# Patient Record
Sex: Female | Born: 1960 | Race: White | Hispanic: No | Smoking: Never smoker
Health system: Southern US, Community
[De-identification: ages and names within clinical notes are randomized; demographics above are authoritative.]

## PROBLEM LIST (undated history)

## (undated) HISTORY — PX: TONSILLECTOMY: SUR1361

## (undated) HISTORY — PX: NEPHRECTOMY: SHX65

---

## 1999-02-16 ENCOUNTER — Other Ambulatory Visit: Admission: RE | Admit: 1999-02-16 | Discharge: 1999-02-16 | Payer: Self-pay | Admitting: Gynecology

## 2000-03-01 ENCOUNTER — Other Ambulatory Visit: Admission: RE | Admit: 2000-03-01 | Discharge: 2000-03-01 | Payer: Self-pay | Admitting: Gynecology

## 2008-10-01 ENCOUNTER — Ambulatory Visit (HOSPITAL_COMMUNITY): Admission: RE | Admit: 2008-10-01 | Discharge: 2008-10-01 | Payer: Self-pay | Admitting: Internal Medicine

## 2009-10-07 ENCOUNTER — Ambulatory Visit (HOSPITAL_COMMUNITY): Admission: RE | Admit: 2009-10-07 | Discharge: 2009-10-07 | Payer: Self-pay | Admitting: Internal Medicine

## 2010-10-10 ENCOUNTER — Other Ambulatory Visit (HOSPITAL_COMMUNITY): Payer: Self-pay | Admitting: Internal Medicine

## 2010-10-11 ENCOUNTER — Other Ambulatory Visit (HOSPITAL_COMMUNITY): Payer: Self-pay | Admitting: Internal Medicine

## 2010-10-11 DIAGNOSIS — Z1231 Encounter for screening mammogram for malignant neoplasm of breast: Secondary | ICD-10-CM

## 2010-10-13 ENCOUNTER — Ambulatory Visit (HOSPITAL_COMMUNITY)
Admission: RE | Admit: 2010-10-13 | Discharge: 2010-10-13 | Disposition: A | Discharge status: Home / Self Care | Payer: Self-pay | Source: Ambulatory Visit | Attending: Internal Medicine | Admitting: Internal Medicine

## 2010-10-13 DIAGNOSIS — Z1231 Encounter for screening mammogram for malignant neoplasm of breast: Secondary | ICD-10-CM | POA: Insufficient documentation

## 2011-07-04 ENCOUNTER — Ambulatory Visit (INDEPENDENT_AMBULATORY_CARE_PROVIDER_SITE_OTHER): Payer: Self-pay | Admitting: *Deleted

## 2011-07-04 VITALS — BP 135/85 | HR 78 | Temp 98.1°F | Resp 16 | Ht 61.0 in | Wt 163.7 lb

## 2011-07-04 DIAGNOSIS — Z01419 Encounter for gynecological examination (general) (routine) without abnormal findings: Secondary | ICD-10-CM

## 2011-07-04 NOTE — Progress Notes (Signed)
No complaints today.  Pap Smear:    Completed Pap smear today. Last Pap smear was 09/07/08 and was normal. Per patient has no history of abnormal Pap smears.  Physical exam: Breasts Breasts symmetrical. No skin abnormalities bilateral breasts. No nipple retraction bilateral breasts. No nipple discharge bilateral breasts. No lymphadenopathy. No lumps palpated bilateral breasts.          Pelvic/Bimanual   Ext Genitalia No lesions, no swelling and no discharge observed on external genitalia.         Vagina Vagina pink and normal texture. No lesions observed in vagina. Blood observed in vagina. Patient is currently menstruating.          Cervix Cervix is present. Cervix pink and of normal texture. Blood observed at cervical os.          Uterus Uterus is present and palpable. Uterus in normal position and normal size.       Adnexae Bilateral ovaries present and palpable. No tenderness on palpation.        Rectovaginal No rectal exam completed today since patient had no rectal complaints.

## 2011-07-04 NOTE — Patient Instructions (Signed)
Taught patient how to perform BSE and gave educational materials to take home. Let her know [facility] will cover Pap smears every 3 years unless has a history of abnormal Pap smears. Told patient about free cervical cancer screenings to receive a Pap smear if would like one at 1-2 years. Patient's last mammogram was 10/13/10. Told patient she does not need another mammogram until after 10/13/11. Gave patient number to call and schedule mammogram. Patient has an IUD that needs to be taken out. Scheduled patient appointment at [facility] to have IUD removed on 08/10/11 at 1345. Gave patient appointment. Let patient know will follow up with her within the next couple weeks by letter or phone with results. Patient verbalized understanding.

## 2011-07-17 ENCOUNTER — Encounter: Payer: Self-pay | Admitting: Obstetrics and Gynecology

## 2011-08-10 ENCOUNTER — Ambulatory Visit: Payer: Self-pay | Admitting: Obstetrics and Gynecology

## 2011-08-28 ENCOUNTER — Ambulatory Visit: Payer: Self-pay | Admitting: Advanced Practice Midwife

## 2011-09-29 ENCOUNTER — Other Ambulatory Visit (HOSPITAL_COMMUNITY): Payer: Self-pay | Admitting: Internal Medicine

## 2011-09-29 DIAGNOSIS — Z1231 Encounter for screening mammogram for malignant neoplasm of breast: Secondary | ICD-10-CM

## 2011-10-25 ENCOUNTER — Ambulatory Visit (HOSPITAL_COMMUNITY): Payer: Self-pay

## 2011-12-28 ENCOUNTER — Ambulatory Visit (HOSPITAL_COMMUNITY)
Admission: RE | Admit: 2011-12-28 | Discharge: 2011-12-28 | Disposition: A | Discharge status: Home / Self Care | Payer: Self-pay | Source: Ambulatory Visit | Attending: Internal Medicine | Admitting: Internal Medicine

## 2011-12-28 DIAGNOSIS — Z1231 Encounter for screening mammogram for malignant neoplasm of breast: Secondary | ICD-10-CM

## 2012-01-03 ENCOUNTER — Encounter: Payer: Self-pay | Admitting: *Deleted

## 2015-05-31 ENCOUNTER — Other Ambulatory Visit: Payer: Self-pay | Admitting: Emergency Medicine

## 2015-05-31 DIAGNOSIS — Z1231 Encounter for screening mammogram for malignant neoplasm of breast: Secondary | ICD-10-CM

## 2015-06-02 ENCOUNTER — Ambulatory Visit
Admission: RE | Admit: 2015-06-02 | Discharge: 2015-06-02 | Disposition: A | Discharge status: Home / Self Care | Payer: No Typology Code available for payment source | Source: Ambulatory Visit | Attending: Emergency Medicine | Admitting: Emergency Medicine

## 2015-06-02 DIAGNOSIS — Z1231 Encounter for screening mammogram for malignant neoplasm of breast: Secondary | ICD-10-CM

## 2015-06-04 ENCOUNTER — Ambulatory Visit: Payer: Self-pay

## 2015-06-04 DIAGNOSIS — R928 Other abnormal and inconclusive findings on diagnostic imaging of breast: Secondary | ICD-10-CM

## 2015-06-04 NOTE — Progress Notes (Unsigned)
Discussed results of mammogram. [facility] to schedule furture studies

## 2015-07-14 ENCOUNTER — Other Ambulatory Visit: Payer: Self-pay | Admitting: Emergency Medicine

## 2015-07-14 DIAGNOSIS — R928 Other abnormal and inconclusive findings on diagnostic imaging of breast: Secondary | ICD-10-CM

## 2015-07-19 ENCOUNTER — Ambulatory Visit
Admission: RE | Admit: 2015-07-19 | Discharge: 2015-07-19 | Disposition: A | Discharge status: Home / Self Care | Payer: No Typology Code available for payment source | Source: Ambulatory Visit | Attending: Emergency Medicine | Admitting: Emergency Medicine

## 2015-07-19 DIAGNOSIS — R928 Other abnormal and inconclusive findings on diagnostic imaging of breast: Secondary | ICD-10-CM

## 2015-07-19 DIAGNOSIS — N63 Unspecified lump in breast: Secondary | ICD-10-CM | POA: Diagnosis not present

## 2016-01-26 ENCOUNTER — Other Ambulatory Visit: Payer: Self-pay | Admitting: Obstetrics and Gynecology

## 2016-01-28 LAB — CYTOLOGY - PAP

## 2016-02-04 ENCOUNTER — Other Ambulatory Visit: Payer: Self-pay | Admitting: Family Medicine

## 2016-02-05 LAB — CMP12+LP+TP+TSH+6AC+CBC/D/PLT
ALT: 42 IU/L — ABNORMAL HIGH (ref 0–32)
AST: 35 IU/L (ref 0–40)
Albumin/Globulin Ratio: 1.4 (ref 1.2–2.2)
Albumin: 4.2 g/dL (ref 3.5–5.5)
Alkaline Phosphatase: 141 IU/L — ABNORMAL HIGH (ref 39–117)
BUN/Creatinine Ratio: 26 — ABNORMAL HIGH (ref 9–23)
BUN: 11 mg/dL (ref 6–24)
Basophils Absolute: 0 x10E3/uL (ref 0.0–0.2)
Basos: 1 %
Bilirubin Total: 0.4 mg/dL (ref 0.0–1.2)
Calcium: 10.7 mg/dL — ABNORMAL HIGH (ref 8.7–10.2)
Chloride: 101 mmol/L (ref 96–106)
Chol/HDL Ratio: 2.1 ratio (ref 0.0–4.4)
Cholesterol, Total: 106 mg/dL (ref 100–199)
Creatinine, Ser: 0.43 mg/dL — ABNORMAL LOW (ref 0.57–1.00)
EOS (ABSOLUTE): 0.2 x10E3/uL (ref 0.0–0.4)
Eos: 3 %
Free Thyroxine Index: 6.9 — ABNORMAL HIGH (ref 1.2–4.9)
GFR calc Af Amer: 132 mL/min/1.73
GFR calc non Af Amer: 115 mL/min/1.73
GGT: 23 IU/L (ref 0–60)
Globulin, Total: 2.9 g/dL (ref 1.5–4.5)
Glucose: 92 mg/dL (ref 65–99)
HDL: 50 mg/dL
Hematocrit: 39 % (ref 34.0–46.6)
Hemoglobin: 12.7 g/dL (ref 11.1–15.9)
Immature Grans (Abs): 0 x10E3/uL (ref 0.0–0.1)
Immature Granulocytes: 0 %
Iron: 110 ug/dL (ref 27–159)
LDH: 240 IU/L — ABNORMAL HIGH (ref 119–226)
LDL Calculated: 39 mg/dL (ref 0–99)
Lymphocytes Absolute: 2.9 x10E3/uL (ref 0.7–3.1)
Lymphs: 41 %
MCH: 28 pg (ref 26.6–33.0)
MCHC: 32.6 g/dL (ref 31.5–35.7)
MCV: 86 fL (ref 79–97)
Monocytes Absolute: 0.8 x10E3/uL (ref 0.1–0.9)
Monocytes: 11 %
Neutrophils Absolute: 3.1 x10E3/uL (ref 1.4–7.0)
Neutrophils: 44 %
Phosphorus: 4.3 mg/dL (ref 2.5–4.5)
Platelets: 296 x10E3/uL (ref 150–379)
Potassium: 4.7 mmol/L (ref 3.5–5.2)
RBC: 4.54 x10E6/uL (ref 3.77–5.28)
RDW: 14.8 % (ref 12.3–15.4)
Sodium: 141 mmol/L (ref 134–144)
T3 Uptake Ratio: 44 % — ABNORMAL HIGH (ref 24–39)
T4, Total: 15.6 ug/dL — ABNORMAL HIGH (ref 4.5–12.0)
TSH: <0.006 u[IU]/mL — ABNORMAL LOW (ref 0.450–4.500)
Total Protein: 7.1 g/dL (ref 6.0–8.5)
Triglycerides: 87 mg/dL (ref 0–149)
Uric Acid: 6.3 mg/dL (ref 2.5–7.1)
VLDL Cholesterol Cal: 17 mg/dL (ref 5–40)
WBC: 7 x10E3/uL (ref 3.4–10.8)

## 2016-02-05 LAB — HGB A1C W/O EAG: Hgb A1c MFr Bld: 5.9 % — ABNORMAL HIGH (ref 4.8–5.6)

## 2016-02-05 LAB — VITAMIN D 25 HYDROXY (VIT D DEFICIENCY, FRACTURES): VIT D 25 HYDROXY: 45.6 ng/mL (ref 30.0–100.0)

## 2016-05-31 ENCOUNTER — Other Ambulatory Visit: Payer: Self-pay | Admitting: Internal Medicine

## 2016-05-31 DIAGNOSIS — Z1231 Encounter for screening mammogram for malignant neoplasm of breast: Secondary | ICD-10-CM

## 2016-06-05 ENCOUNTER — Ambulatory Visit: Admission: RE | Admit: 2016-06-05 | Payer: No Typology Code available for payment source | Source: Ambulatory Visit

## 2016-10-16 IMAGING — MG MM DIAG BREAST TOMO UNI RIGHT
6 series · 6 of 14 positions shown · non-contrast
Comparison: Previous exam(s).

CLINICAL DATA: Patient presents for additional views of the right
breast as followup to a recent screening exam suggesting a possible
mass over the upper breast on the MLO view only.

EXAM:
DIGITAL DIAGNOSTIC right MAMMOGRAM WITH 3D TOMOSYNTHESIS
ULTRASOUND right BREAST

[R CC]
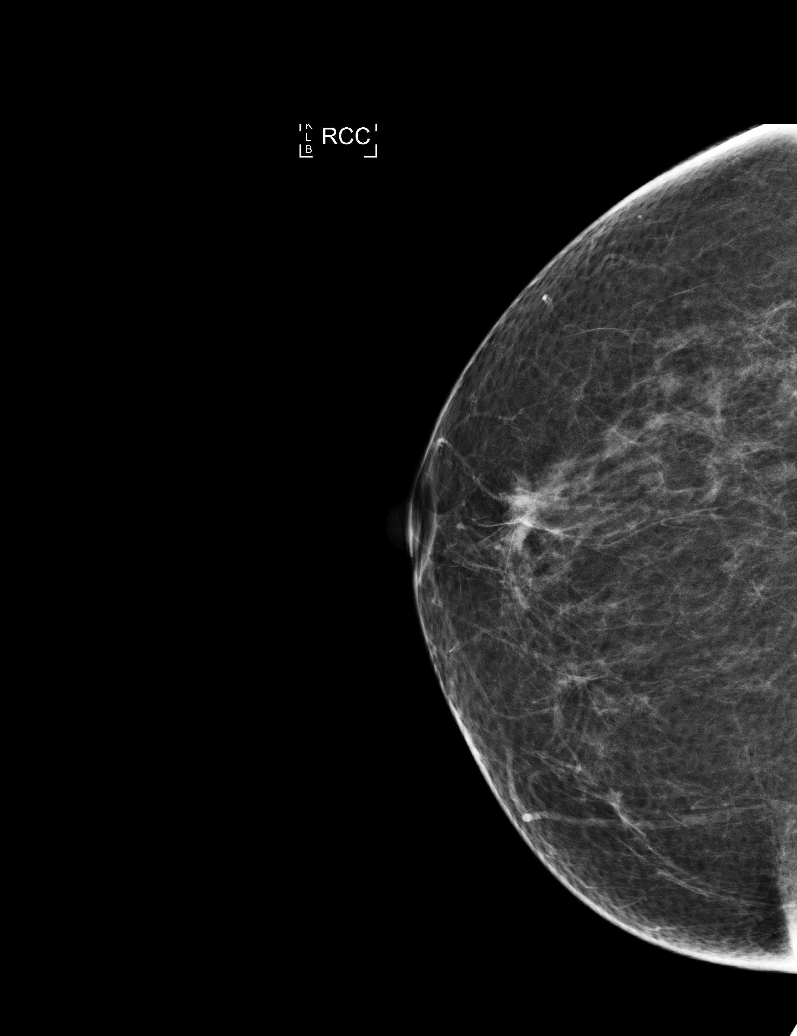

[R CC synth-2D]
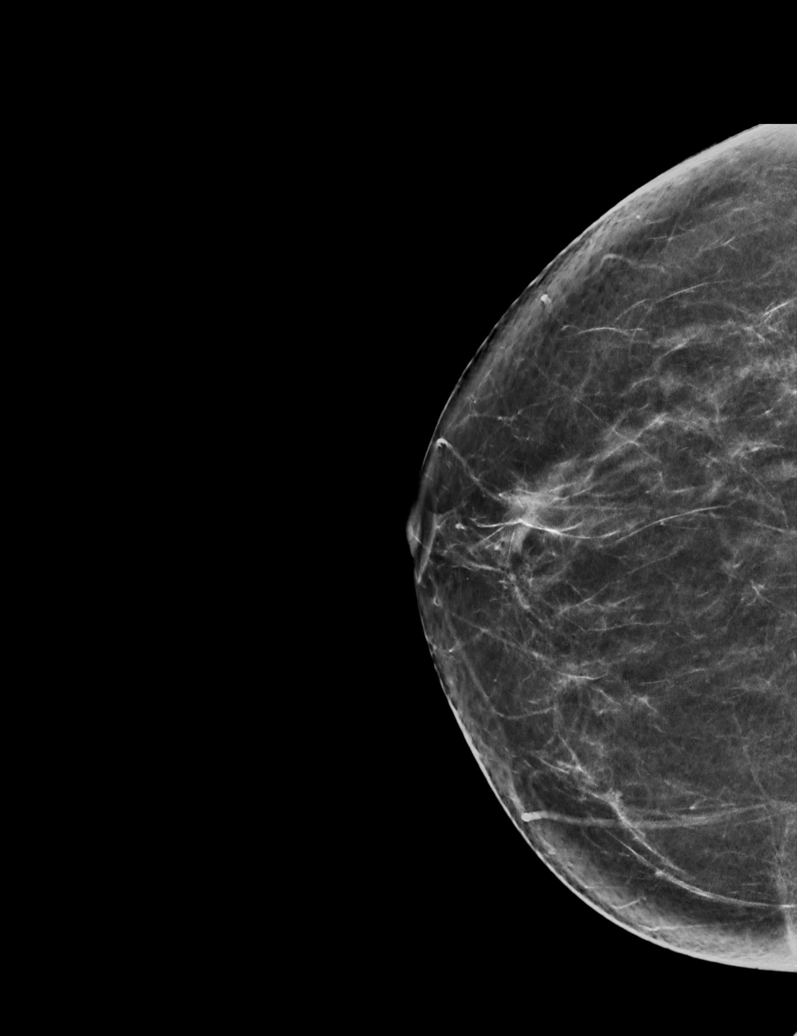

[R MLO synth-2D]
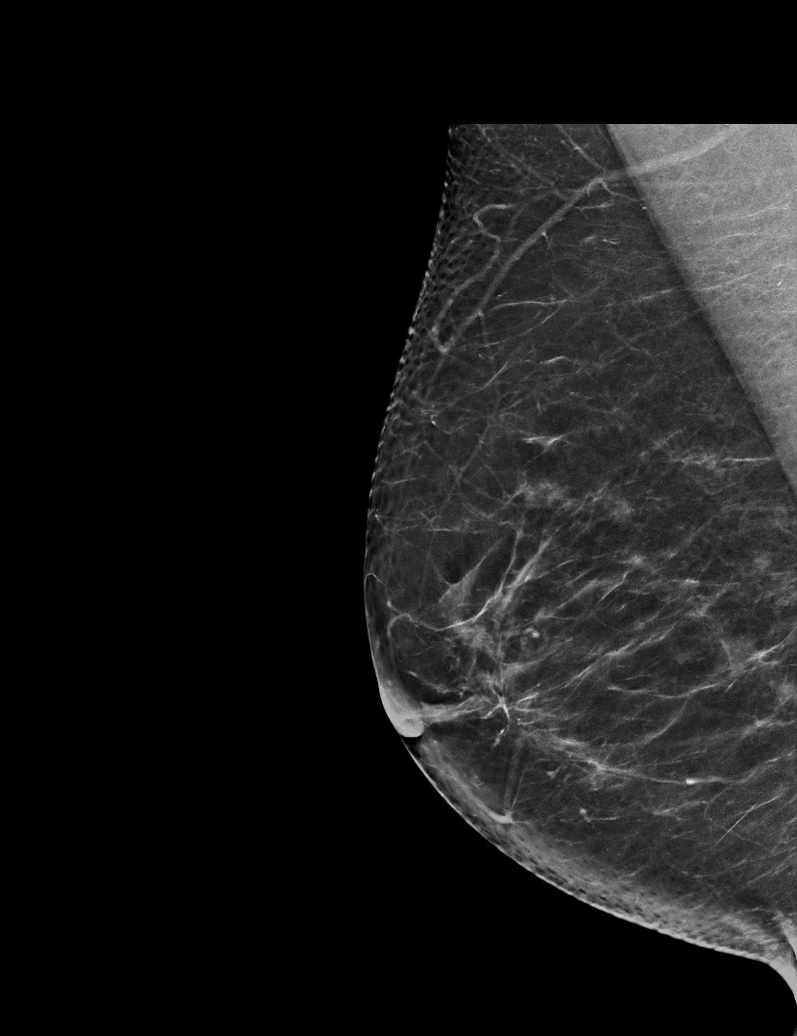

[R MLO]
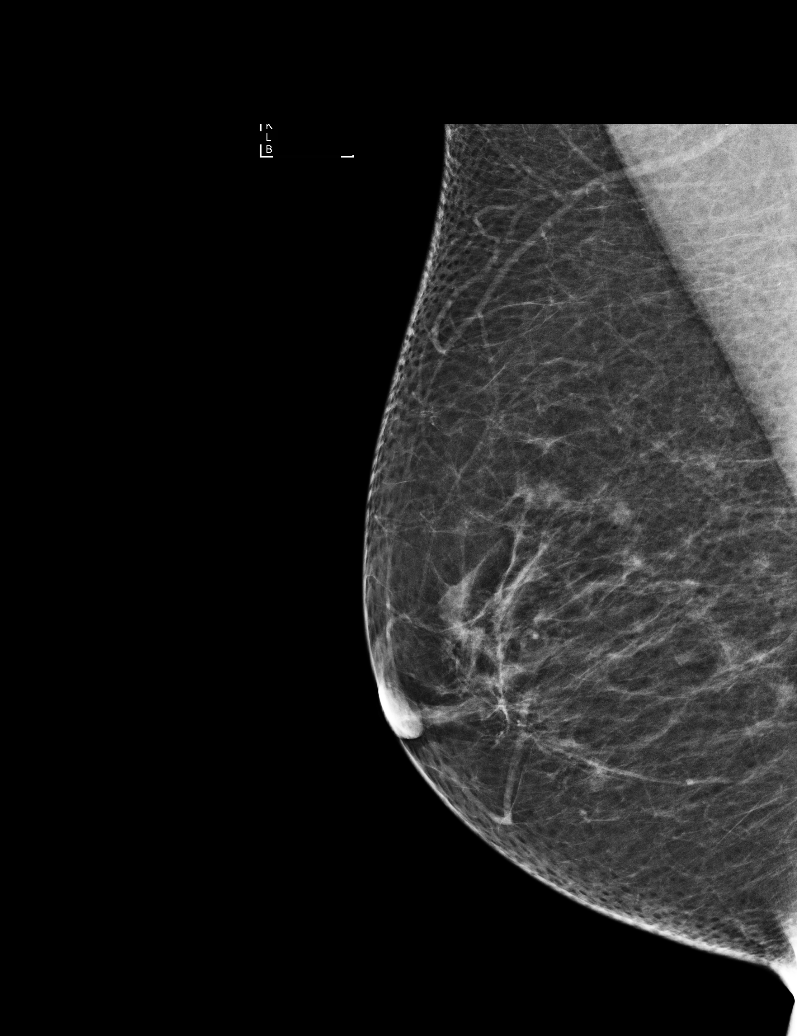

[R MLO tomo · tomo slice 37/74.0]
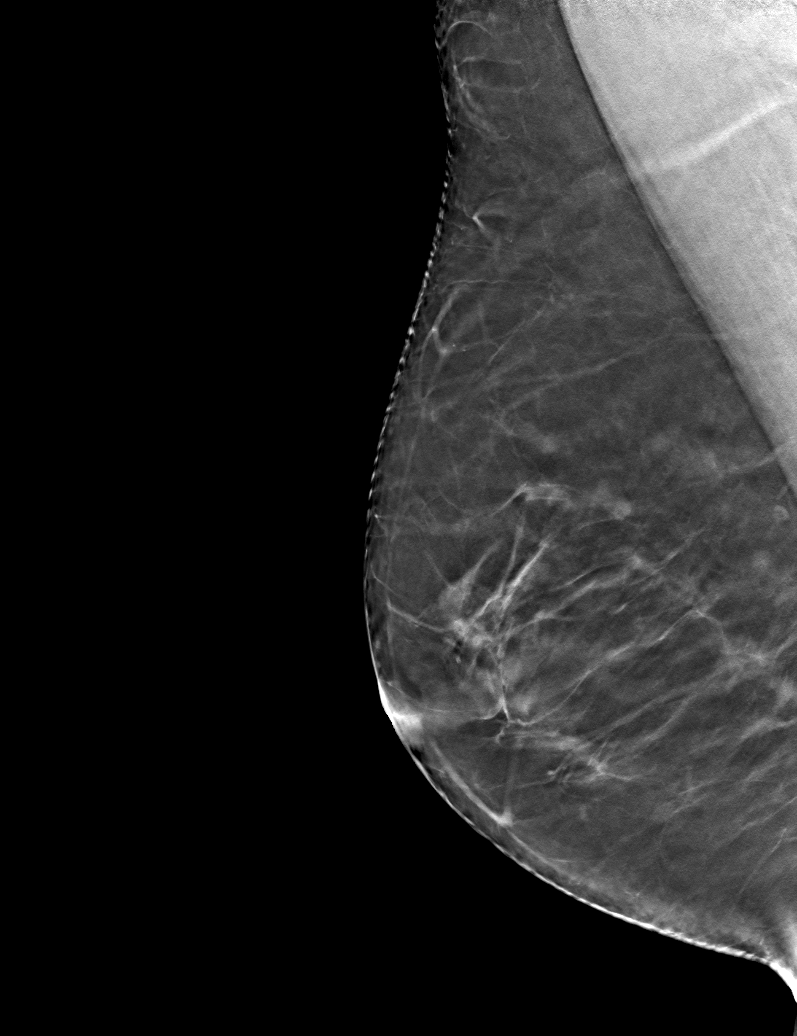

[R CC tomo · tomo slice 37/73.0]
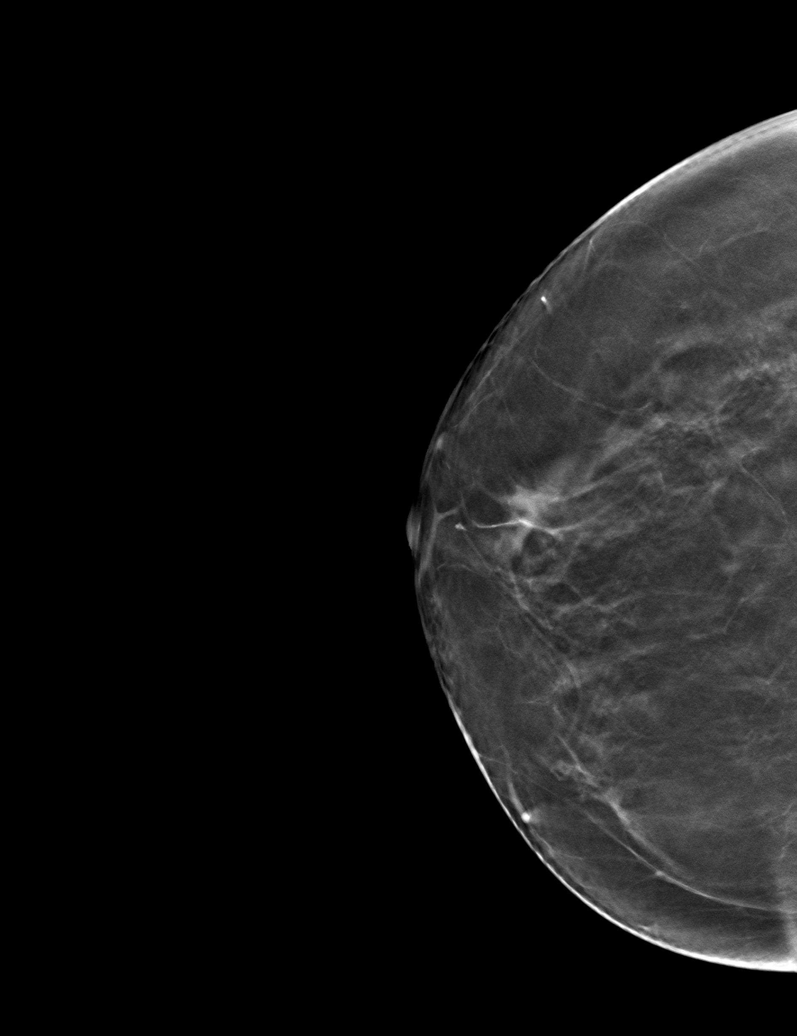

[6 of 14 positions shown; findings below may reference images not displayed]

ACR Breast Density Category b: There are scattered areas of
fibroglandular density.
FINDINGS: Cc and MLO tomographic images demonstrate findings suggesting
persistence of an oval circumscribed 5 mm mass over the posterior
third of the slightly outer upper right breast. Remainder of the
exam is unchanged.

Targeted ultrasound is performed, showing no definite focal
abnormality over the outer right breast from the [DATE] to the 11
o'clock position.
IMPRESSION: Probable benign mass over the slightly outer upper right breast not
seen sonographically.

RECOMMENDATION:
Recommend a 6 month followup diagnostic right breast mammogram to
document stability of this probable benign finding.

I have discussed the findings and recommendations with the patient.
Results were also provided in writing at the conclusion of the
visit. If applicable, a reminder letter will be sent to the patient
regarding the next appointment.

BI-RADS CATEGORY  3: Probably benign.

## 2017-01-26 ENCOUNTER — Ambulatory Visit: Payer: Self-pay | Admitting: *Deleted

## 2017-01-26 VITALS — BP 162/84 | HR 79 | Ht 61.5 in | Wt 162.0 lb

## 2017-01-26 DIAGNOSIS — Z0000 Encounter for general adult medical examination without abnormal findings: Secondary | ICD-10-CM

## 2017-01-26 NOTE — Progress Notes (Signed)
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 Denies HTN Hx.  Labs Drawn. Replacements ROI form signed. Tobacco Free Attestation form signed.  Forms placed in paper chart.

## 2017-01-27 LAB — CMP12+LP+TP+TSH+6AC+CBC/D/PLT
A/G RATIO: 1.3 (ref 1.2–2.2)
ALT: 27 IU/L (ref 0–32)
AST: 24 IU/L (ref 0–40)
Albumin: 4.3 g/dL (ref 3.5–5.5)
Alkaline Phosphatase: 102 IU/L (ref 39–117)
BUN / CREAT RATIO: 18 (ref 9–23)
BUN: 11 mg/dL (ref 6–24)
Basophils Absolute: 0 10*3/uL (ref 0.0–0.2)
Basos: 1 %
Bilirubin Total: 0.3 mg/dL (ref 0.0–1.2)
CALCIUM: 9.8 mg/dL (ref 8.7–10.2)
CHLORIDE: 101 mmol/L (ref 96–106)
CHOL/HDL RATIO: 2.4 ratio (ref 0.0–4.4)
CREATININE: 0.6 mg/dL (ref 0.57–1.00)
Cholesterol, Total: 137 mg/dL (ref 100–199)
EOS (ABSOLUTE): 0.2 10*3/uL (ref 0.0–0.4)
EOS: 3 %
Free Thyroxine Index: 3.2 (ref 1.2–4.9)
GFR, EST AFRICAN AMERICAN: 118 mL/min/{1.73_m2} (ref >59)
GFR, EST NON AFRICAN AMERICAN: 102 mL/min/{1.73_m2} (ref >59)
GGT: 18 IU/L (ref 0–60)
GLUCOSE: 91 mg/dL (ref 65–99)
Globulin, Total: 3.2 g/dL (ref 1.5–4.5)
HDL: 56 mg/dL (ref >39)
Hematocrit: 41.4 % (ref 34.0–46.6)
Hemoglobin: 13.8 g/dL (ref 11.1–15.9)
IRON: 81 ug/dL (ref 27–159)
Immature Grans (Abs): 0 10*3/uL (ref 0.0–0.1)
Immature Granulocytes: 0 %
LDH: 270 IU/L — AB (ref 119–226)
LDL Calculated: 59 mg/dL (ref 0–99)
LYMPHS ABS: 2.7 10*3/uL (ref 0.7–3.1)
Lymphs: 36 %
MCH: 30.5 pg (ref 26.6–33.0)
MCHC: 33.3 g/dL (ref 31.5–35.7)
MCV: 91 fL (ref 79–97)
MONOCYTES: 7 %
Monocytes Absolute: 0.5 10*3/uL (ref 0.1–0.9)
NEUTROS ABS: 3.9 10*3/uL (ref 1.4–7.0)
Neutrophils: 53 %
PHOSPHORUS: 3.8 mg/dL (ref 2.5–4.5)
POTASSIUM: 4.5 mmol/L (ref 3.5–5.2)
Platelets: 347 10*3/uL (ref 150–379)
RBC: 4.53 x10E6/uL (ref 3.77–5.28)
RDW: 14 % (ref 12.3–15.4)
Sodium: 140 mmol/L (ref 134–144)
T3 Uptake Ratio: 31 % (ref 24–39)
T4 TOTAL: 10.2 ug/dL (ref 4.5–12.0)
TOTAL PROTEIN: 7.5 g/dL (ref 6.0–8.5)
TSH: 0.016 u[IU]/mL — ABNORMAL LOW (ref 0.450–4.500)
Triglycerides: 109 mg/dL (ref 0–149)
URIC ACID: 5.5 mg/dL (ref 2.5–7.1)
VLDL Cholesterol Cal: 22 mg/dL (ref 5–40)
WBC: 7.4 10*3/uL (ref 3.4–10.8)

## 2017-01-27 LAB — HGB A1C W/O EAG: HEMOGLOBIN A1C: 5.6 % (ref 4.8–5.6)

## 2017-02-01 DIAGNOSIS — A630 Anogenital (venereal) warts: Secondary | ICD-10-CM | POA: Insufficient documentation

## 2017-02-01 DIAGNOSIS — E0590 Thyrotoxicosis, unspecified without thyrotoxic crisis or storm: Secondary | ICD-10-CM | POA: Insufficient documentation

## 2017-02-23 NOTE — Progress Notes (Signed)
Results reviewed with pt. Pt reports she was told "they thought it may be Grave's, but they weren't sure." Instructed pt to f/u with PCP and endocrinology. Reviewed diet and exercise recommendations. Pt verbalizes understanding of all instructions and agrees to schedule f/u soon. No further questions/concerns. Copy provided for pt, as well as [facility] handout on Graves disease.

## 2017-05-22 ENCOUNTER — Other Ambulatory Visit: Payer: Self-pay | Admitting: Obstetrics and Gynecology

## 2017-05-22 DIAGNOSIS — R928 Other abnormal and inconclusive findings on diagnostic imaging of breast: Secondary | ICD-10-CM

## 2017-05-22 DIAGNOSIS — N63 Unspecified lump in unspecified breast: Secondary | ICD-10-CM

## 2017-05-30 ENCOUNTER — Other Ambulatory Visit: Payer: Self-pay

## 2017-06-15 ENCOUNTER — Ambulatory Visit
Admission: RE | Admit: 2017-06-15 | Discharge: 2017-06-15 | Disposition: A | Discharge status: Home / Self Care | Payer: No Typology Code available for payment source | Source: Ambulatory Visit | Attending: Obstetrics and Gynecology | Admitting: Obstetrics and Gynecology

## 2017-06-15 ENCOUNTER — Ambulatory Visit: Payer: Self-pay

## 2017-06-15 DIAGNOSIS — N63 Unspecified lump in unspecified breast: Secondary | ICD-10-CM

## 2017-06-15 DIAGNOSIS — R928 Other abnormal and inconclusive findings on diagnostic imaging of breast: Secondary | ICD-10-CM

## 2017-06-18 ENCOUNTER — Encounter (HOSPITAL_COMMUNITY): Payer: Self-pay

## 2017-07-14 DEATH — deceased

## 2017-10-02 ENCOUNTER — Ambulatory Visit: Payer: Self-pay | Admitting: Registered Nurse

## 2017-10-02 VITALS — BP 154/88 | HR 68 | Temp 97.8°F

## 2017-10-02 DIAGNOSIS — H6593 Unspecified nonsuppurative otitis media, bilateral: Secondary | ICD-10-CM

## 2017-10-02 DIAGNOSIS — J0100 Acute maxillary sinusitis, unspecified: Secondary | ICD-10-CM

## 2017-10-02 MED ORDER — AMOXICILLIN 875 MG PO TABS: 875.0000 mg | ORAL_TABLET | Freq: Two times a day (BID) | ORAL | 0 refills | Status: AC

## 2017-10-02 MED ORDER — ZINC 13.3 MG MT LOZG: 1.0000 | LOZENGE | Freq: Three times a day (TID) | OROMUCOSAL | Status: AC

## 2017-10-02 MED ORDER — SALINE SPRAY 0.65 % NA SOLN: 2.0000 | NASAL | 0 refills | Status: DC

## 2017-10-02 MED ORDER — FLUTICASONE PROPIONATE 50 MCG/ACT NA SUSP: 1.0000 | Freq: Every day | NASAL | 6 refills | Status: DC

## 2017-10-02 NOTE — Patient Instructions (Signed)
Sinus Rinse What is a sinus rinse? A sinus rinse is a simple home treatment that is used to rinse your sinuses with a sterile mixture of salt and water (saline solution). Sinuses are air-filled spaces in your skull behind the bones of your face and forehead that open into your nasal cavity. You will use the following:  Saline solution.  Neti pot or spray bottle. This releases the saline solution into your nose and through your sinuses. Neti pots and spray bottles can be purchased at [employer], a health food store, or online.  When would I do a sinus rinse? A sinus rinse can help to clear mucus, dirt, dust, or pollen from the nasal cavity. You may do a sinus rinse when you have a cold, a virus, nasal allergy symptoms, a sinus infection, or stuffiness in the nose or sinuses. If you are considering a sinus rinse:  Ask your child's health care provider before performing a sinus rinse on your child.  Do not do a sinus rinse if you have had ear or nasal surgery, ear infection, or blocked ears.  How do I do a sinus rinse?  Wash your hands.  Disinfect your device according to the directions provided and then dry it.  Use the solution that comes with your device or one that is sold separately in stores. Follow the mixing directions on the package.  Fill your device with the amount of saline solution as directed by the device instructions.  Stand over a sink and tilt your head sideways over the sink.  Place the spout of the device in your upper nostril (the one closer to the ceiling).  Gently pour or squeeze the saline solution into the nasal cavity. The liquid should drain to the lower nostril if you are not overly congested.  Gently blow your nose. Blowing too hard may cause ear pain.  Repeat in the other nostril.  Clean and rinse your device with clean water and then air-dry it. Are there risks of a sinus rinse? Sinus rinse is generally very safe and effective. However,  there are a few risks, which include:  A burning sensation in the sinuses. This may happen if you do not make the saline solution as directed. Make sure to follow all directions when making the saline solution.  Infection from contaminated water. This is rare, but possible.  Nasal irritation.  This information is not intended to replace advice given to you by your health care provider. Make sure you discuss any questions you have with your health care provider. Document Released: 02/25/2014 Document Revised: 06/27/2016 Document Reviewed: 12/16/2013 Elsevier Interactive Patient Education  2017 [employer] Sinusitis, Adult Sinusitis is soreness and inflammation of your sinuses. Sinuses are hollow spaces in the bones around your face. Your sinuses are located:  Around your eyes.  In the middle of your forehead.  Behind your nose.  In your cheekbones.  Your sinuses and nasal passages are lined with a stringy fluid (mucus). Mucus normally drains out of your sinuses. When your nasal tissues become inflamed or swollen, the mucus can become trapped or blocked so air cannot flow through your sinuses. This allows bacteria, viruses, and funguses to grow, which leads to infection. Sinusitis can develop quickly and last for 7?10 days (acute) or for more than 12 weeks (chronic). Sinusitis often develops after a cold. What are the causes? This condition is caused by anything that creates swelling in the sinuses or stops mucus from draining, including:  Allergies.  Asthma.  Bacterial or viral infection.  Abnormally shaped bones between the nasal passages.  Nasal growths that contain mucus (nasal polyps).  Narrow sinus openings.  Pollutants, such as chemicals or irritants in the air.  A foreign object stuck in the nose.  A fungal infection. This is rare.  What increases the risk? The following factors may make you more likely to develop this condition:  Having allergies or  asthma.  Having had a recent cold or respiratory tract infection.  Having structural deformities or blockages in your nose or sinuses.  Having a weak immune system.  Doing a lot of swimming or diving.  Overusing nasal sprays.  Smoking.  What are the signs or symptoms? The main symptoms of this condition are pain and a feeling of pressure around the affected sinuses. Other symptoms include:  Upper toothache.  Earache.  Headache.  Bad breath.  Decreased sense of smell and taste.  A cough that may get worse at night.  Fatigue.  Fever.  Thick drainage from your nose. The drainage is often green and it may contain pus (purulent).  Stuffy nose or congestion.  Postnasal drip. This is when extra mucus collects in the throat or back of the nose.  Swelling and warmth over the affected sinuses.  Sore throat.  Sensitivity to light.  How is this diagnosed? This condition is diagnosed based on symptoms, a medical history, and a physical exam. To find out if your condition is acute or chronic, your health care provider may:  Look in your nose for signs of nasal polyps.  Tap over the affected sinus to check for signs of infection.  View the inside of your sinuses using an imaging device that has a light attached (endoscope).  If your health care provider suspects that you have chronic sinusitis, you may also:  Be tested for allergies.  Have a sample of mucus taken from your nose (nasal culture) and checked for bacteria.  Have a mucus sample examined to see if your sinusitis is related to an allergy.  If your sinusitis does not respond to treatment and it lasts longer than 8 weeks, you may have an MRI or CT scan to check your sinuses. These scans also help to determine how severe your infection is. In rare cases, a bone biopsy may be done to rule out more serious types of fungal sinus disease. How is this treated? Treatment for sinusitis depends on the cause and  whether your condition is chronic or acute. If a virus is causing your sinusitis, your symptoms will go away on their own within 10 days. You may be given medicines to relieve your symptoms, including:  Topical nasal decongestants. They shrink swollen nasal passages and let mucus drain from your sinuses.  Antihistamines. These drugs block inflammation that is triggered by allergies. This can help to ease swelling in your nose and sinuses.  Topical nasal corticosteroids. These are nasal sprays that ease inflammation and swelling in your nose and sinuses.  Nasal saline washes. These rinses can help to get rid of thick mucus in your nose.  If your condition is caused by bacteria, you will be given an antibiotic medicine. If your condition is caused by a fungus, you will be given an antifungal medicine. Surgery may be needed to correct underlying conditions, such as narrow nasal passages. Surgery may also be needed to remove polyps. Follow these instructions at home: Medicines  Take, use, or apply over-the-counter and prescription medicines only as told by   your health care provider. These may include nasal sprays.  If you were prescribed an antibiotic medicine, take it as told by your health care provider. Do not stop taking the antibiotic even if you start to feel better. Hydrate and Humidify  Drink enough water to keep your urine clear or pale yellow. Staying hydrated will help to thin your mucus.  Use a cool mist humidifier to keep the humidity level in your home above 50%.  Inhale steam for 10-15 minutes, 3-4 times a day or as told by your health care provider. You can do this in the bathroom while a hot shower is running.  Limit your exposure to cool or dry air. Rest  Rest as much as possible.  Sleep with your head raised (elevated).  Make sure to get enough sleep each night. General instructions  Apply a warm, moist washcloth to your face 3-4 times a day or as told by your  health care provider. This will help with discomfort.  Wash your hands often with soap and water to reduce your exposure to viruses and other germs. If soap and water are not available, use hand sanitizer.  Do not smoke. Avoid being around people who are smoking (secondhand smoke).  Keep all follow-up visits as told by your health care provider. This is important. Contact a health care provider if:  You have a fever.  Your symptoms get worse.  Your symptoms do not improve within 10 days. Get help right away if:  You have a severe headache.  You have persistent vomiting.  You have pain or swelling around your face or eyes.  You have vision problems.  You develop confusion.  Your neck is stiff.  You have trouble breathing. This information is not intended to replace advice given to you by your health care provider. Make sure you discuss any questions you have with your health care provider. Document Released: 07/31/2005 Document Revised: 03/26/2016 Document Reviewed: 05/26/2015 Elsevier Interactive Patient Education  2018 [employer]. Otitis Media With Effusion, Pediatric Otitis media with effusion (OME) occurs when there is inflammation of the middle ear and fluid in the middle ear space. There are no signs and symptoms of infection. The middle ear space contains air and the bones for hearing. Air in the middle ear space helps to transmit sound to the brain. OME is a common condition in children, and it often occurs after an ear infection. This condition may be present for several weeks or longer after an ear infection. Most cases of this condition get better on their own. What are the causes? OME is caused by a blockage of the eustachian tube in one or both ears. These tubes drain fluid in the ears to the back of the nose (nasopharynx). If the tissue in the tube swells up (edema), the tube closes. This prevents fluid from draining. Blockage can be caused by:  Ear  infections.  Colds and other upper respiratory infections.  Allergies.  Irritants, such as tobacco smoke.  Enlarged adenoids. The adenoids are areas of soft tissue located high in the back of the throat, behind the nose and the roof of the mouth. They are part of the body's natural defense (immune) system.  A mass in the nasopharynx.  Damage to the ear caused by pressure changes (barotrauma).  What increases the risk? Your child is more likely to develop this condition if:  He or she has repeated ear and sinus infections.  He or she has allergies.  He  or she is exposed to tobacco smoke.  He or she attends daycare.  He or she is not breastfed.  What are the signs or symptoms? Symptoms of this condition may not be obvious. Sometimes this condition does not have any symptoms, or symptoms may overlap with those of a cold or upper respiratory tract illness. Symptoms of this condition include:  Temporary hearing loss.  A feeling of fullness in the ear without pain.  Irritability or agitation.  Balance (vestibular) problems.  As a result of hearing loss, your child may:  Listen to the TV at a loud volume.  Not respond to questions.  Ask "What?" often when spoken to.  Mistake or confuse one sound or word for another.  Perform poorly at school.  Have a poor attention span.  Become agitated or irritated easily.  How is this diagnosed? This condition is diagnosed with an ear exam. Your child's health care provider will look inside your child's ear with an instrument (otoscope) to check for redness, swelling, and fluid. Other tests may be done, including:  A test to check the movement of the eardrum (pneumatic otoscopy). This is done by squeezing a small amount of air into the ear.  A test that changes air pressure in the middle ear to check how well the eardrum moves and to see if the eustachian tube is working (tympanogram).  Hearing test (audiogram). This test  involves playing tones at different pitches to see if your child can hear each tone.  How is this treated? Treatment for this condition depends on the cause. In many cases, the fluid goes away on its own. In some cases, your child may need a procedure to create a hole in the eardrum to allow fluid to drain (myringotomy) and to insert small drainage tubes (tympanostomy tubes) into the eardrums. These tubes help to drain fluid and prevent infection. This procedure may be recommended if:  OME does not get better over several months.  Your child has many ear infections within several months.  Your child has noticeable hearing loss.  Your child has problems with speech and language development.  Surgery may also be done to remove the adenoids (adenoidectomy). Follow these instructions at home:  Give over-the-counter and prescription medicines only as told by your child's health care provider.  Keep children away from any tobacco smoke.  Keep all follow-up visits as told by your child's health care provider. This is important. How is this prevented?  Keep your child's vaccinations up to date. Make sure your child gets all recommended vaccinations, including a pneumonia and flu vaccine.  Encourage hand washing. Your child should wash his or her hands often with soap and water. If there is no soap and water, he or she should use hand sanitizer.  Avoid exposing your child to tobacco smoke.  Breastfeed your baby, if possible. Babies who are breastfed as long as possible are less likely to develop this condition. Contact a health care provider if:  Your child's hearing does not get better after 3 months.  Your child's hearing is worse.  Your child has ear pain.  Your child has a fever.  Your child has drainage from the ear.  Your child is dizzy.  Your child has a lump on his or her neck. Get help right away if:  Your child has bleeding from the nose.  Your child cannot move  part of her or his face.  Your child has trouble breathing.  Your child cannot  smell.  Your child develops severe congestion.  Your child develops weakness.  Your child who is younger than 3 months has a temperature of 100F (38C) or higher. Summary  Otitis media with effusion (OME) occurs when there is inflammation of the middle ear and fluid in the middle ear space.  This condition is caused by blockage of one or both eustachian tubes, which drain fluid in the ears to the back of the nose.  Symptoms of this condition can include temporary hearing loss, a feeling of fullness in the ear, irritability or agitation, and balance (vestibular) problems. Sometimes, there are no symptoms.  This condition is diagnosed with an ear exam and tests, such as pneumatic otoscopy, tympanogram, and audiogram.  Treatment for this condition depends on the cause. In many cases, the fluid goes away on its own. This information is not intended to replace advice given to you by your health care provider. Make sure you discuss any questions you have with your health care provider. Document Released: 10/21/2003 Document Revised: 06/22/2016 Document Reviewed: 06/22/2016 Elsevier Interactive Patient Education  2017 [employer]

## 2017-10-02 NOTE — Progress Notes (Signed)
Subjective:    Patient ID: Regina Hutchinson, female    DOB: March 27, 1961, 57 y.o.   MRN: [id number]  56y/o caucasian female new Pt c/o frontal and maxillary sinus pain/pressure, bil ear pressure, sore throat, chest congestion, prod cough. Sx x1 week. Reports body aches and subjective fever over the weekend but that these have resolved now. + sick contacts at work.  Has tried OTC medications.  Denied seasonal allergies.      Review of Systems  Constitutional: Positive for fever. Negative for activity change, appetite change, chills, diaphoresis, fatigue and unexpected weight change.  HENT: Positive for congestion, ear pain, postnasal drip, rhinorrhea, sinus pressure, sinus pain and sore throat. Negative for dental problem, drooling, ear discharge, facial swelling, hearing loss, mouth sores, nosebleeds, sneezing, tinnitus, trouble swallowing and voice change.   Eyes: Negative for photophobia, pain, discharge, redness, itching and visual disturbance.  Respiratory: Positive for cough. Negative for choking, chest tightness, shortness of breath, wheezing and stridor.   Cardiovascular: Negative for chest pain, palpitations and leg swelling.  Gastrointestinal: Negative for abdominal distention, abdominal pain, blood in stool, constipation, diarrhea, nausea and vomiting.  Endocrine: Negative for cold intolerance and heat intolerance.  Genitourinary: Negative for difficulty urinating, dysuria and hematuria.  Musculoskeletal: Positive for myalgias. Negative for arthralgias, back pain, gait problem, joint swelling, neck pain and neck stiffness.  Skin: Negative for color change, pallor, rash and wound.  Allergic/Immunologic: Negative for environmental allergies and food allergies.  Neurological: Positive for headaches. Negative for dizziness, tremors, seizures, syncope, facial asymmetry, speech difficulty, weakness, light-headedness and numbness.  Hematological: Negative for adenopathy. Does not bruise/bleed  easily.  Psychiatric/Behavioral: Negative for agitation, behavioral problems, confusion and sleep disturbance.       Objective:   Physical Exam  Constitutional: She is oriented to person, place, and time. She appears well-developed and well-nourished. She is active and cooperative.  Non-toxic appearance. She does not have a sickly appearance. She appears ill. No distress.  HENT:  Head: Normocephalic and atraumatic.  Right Ear: Hearing, external ear and ear canal normal. A middle ear effusion is present.  Left Ear: Hearing, external ear and ear canal normal. A middle ear effusion is present.  Nose: Mucosal edema and rhinorrhea present. No nose lacerations, sinus tenderness, nasal deformity, septal deviation or nasal septal hematoma. No epistaxis.  No foreign bodies. Right sinus exhibits maxillary sinus tenderness. Right sinus exhibits no frontal sinus tenderness. Left sinus exhibits maxillary sinus tenderness. Left sinus exhibits no frontal sinus tenderness.  Mouth/Throat: Uvula is midline and mucous membranes are normal. Mucous membranes are not pale, not dry and not cyanotic. She does not have dentures. No oral lesions. No trismus in the jaw. Normal dentition. No dental abscesses, uvula swelling, lacerations or dental caries. Posterior oropharyngeal edema and posterior oropharyngeal erythema present. No oropharyngeal exudate or tonsillar abscesses.  Cobblestoning posterior pharynx; bilateral allergic shiners; bilateral nasal turbinates with clear discharge erythema; 10%  Cerumen impacted 12-2 oclock right ear; bilateral TMs air fluid level clear  Eyes: Conjunctivae, EOM and lids are normal. Pupils are equal, round, and reactive to light. Right eye exhibits no chemosis, no discharge, no exudate and no hordeolum. No foreign body present in the right eye. Left eye exhibits no chemosis, no discharge, no exudate and no hordeolum. No foreign body present in the left eye. Right conjunctiva is not injected.  Right conjunctiva has no hemorrhage. Left conjunctiva is not injected. Left conjunctiva has no hemorrhage. No scleral icterus. Right eye exhibits normal extraocular motion  and no nystagmus. Left eye exhibits normal extraocular motion and no nystagmus. Right pupil is round and reactive. Left pupil is round and reactive. Pupils are equal.  Neck: Trachea normal, normal range of motion and phonation normal. Neck supple. No tracheal tenderness and no muscular tenderness present. No neck rigidity. No tracheal deviation, no edema, no erythema and normal range of motion present. No thyroid mass and no thyromegaly present.  Cardiovascular: Normal rate, regular rhythm, S1 normal, S2 normal, normal heart sounds and intact distal pulses. PMI is not displaced. Exam reveals no gallop and no friction rub.  No murmur heard. Pulmonary/Chest: Effort normal and breath sounds normal. No accessory muscle usage or stridor. No respiratory distress. She has no decreased breath sounds. She has no wheezes. She has no rhonchi. She has no rales. She exhibits no tenderness.  No cough observed in exam room; spoke full sentences without difficulty  Abdominal: Soft. Normal appearance. She exhibits no distension, no fluid wave and no ascites. There is no rigidity and no guarding.  Musculoskeletal: Normal range of motion. She exhibits no edema or tenderness.       Right shoulder: Normal.       Left shoulder: Normal.       Right elbow: Normal.      Left elbow: Normal.       Right hip: Normal.       Left hip: Normal.       Right knee: Normal.       Left knee: Normal.       Cervical back: Normal.       Thoracic back: Normal.       Lumbar back: Normal.       Right hand: Normal.       Left hand: Normal.  Lymphadenopathy:       Head (right side): No submental, no submandibular, no tonsillar, no preauricular, no posterior auricular and no occipital adenopathy present.       Head (left side): No submental, no submandibular, no  tonsillar, no preauricular, no posterior auricular and no occipital adenopathy present.    She has no cervical adenopathy.       Right cervical: No superficial cervical, no deep cervical and no posterior cervical adenopathy present.      Left cervical: No superficial cervical, no deep cervical and no posterior cervical adenopathy present.  Neurological: She is alert and oriented to person, place, and time. She has normal strength. She is not disoriented. She displays no atrophy and no tremor. No cranial nerve deficit or sensory deficit. She exhibits normal muscle tone. She displays no seizure activity. Coordination and gait normal. GCS eye subscore is 4. GCS verbal subscore is 5. GCS motor subscore is 6.  On/off exam table/in/out of chair without difficulty; gait sure and steady in hallway  Skin: Skin is warm, dry and intact. No abrasion, no bruising, no burn, no ecchymosis, no laceration, no lesion, no petechiae and no rash noted. She is not diaphoretic. No cyanosis or erythema. No pallor. Nails show no clubbing.  Psychiatric: She has a normal mood and affect. Her speech is normal and behavior is normal. Judgment and thought content normal. Cognition and memory are normal.  Nursing note and vitals reviewed.         Assessment & Plan:  A-acute Maxillary sinusitis nonrecurrent  P-Restart flonase 1 spray each nostril BID #1 RF6 electronic Rx, saline 2 sprays each nostril q2h wa prn congestion.  If no improvement with 48 hours of saline  and flonase use start amoxicillin 875mg  po BID x 10 days #20 RF0 dispensed from [facility].  Tylenol 1000mg  po qID prn pain 4 UD given to patient from clinic stock discussed tylenol doesn raise BP  Denied personal or family history of ENT cancer.  Shower BID especially prior to bed. No evidence of systemic bacterial infection, non toxic and well hydrated.  I do not see where any further testing or imaging is necessary at this time.   I will suggest supportive care, rest,  good hygiene and encourage the patient to take adequate fluids.  The patient is to return to clinic or EMERGENCY ROOM if symptoms worsen or change significantly.  Exitcare handout on sinusitis and sinus rinse given to patient.  Patient verbalized agreement and understanding of treatment plan and had no further questions at this time.   P2:  Hand washing and cover cough  Patient may use normal saline nasal spray 2 sprays each nostril q2h wa as needed. flonase 1 spray each nostril BID #1 RF6 electronic Rx.  Patient denied personal or family history of ENT cancer.  OTC antihistamine of choice claritin/zyrtec 10mg  po daily.  Avoid triggers if possible.  Shower prior to bedtime if exposed to triggers.  If allergic dust/dust mites recommend mattress/pillow covers/encasements; washing linens, vacuuming, sweeping, dusting weekly.  Call or return to clinic as needed if these symptoms worsen or fail to improve as anticipated.   Exitcare handout on allergic rhinitis and sinus rinse given to patient.  Patient verbalized understanding of instructions, agreed with plan of care and had no further questions at this time.  P2:  Avoidance and hand washing.  Hard cerumen upper right canal.  Stop using qtips in ears.  Start 5 gtts mineral oil right external ear canal daily.  Patient verbalized understanding, agreed with plan of care and had no further questions at this time.

## 2019-03-25 ENCOUNTER — Telehealth: Payer: Self-pay | Admitting: *Deleted

## 2019-03-25 NOTE — Telephone Encounter (Signed)
Noted reviewed work injury report from supervisor and patient to follow up for NP evaluation if not improving or worsening symptoms.

## 2019-03-25 NOTE — Telephone Encounter (Signed)
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 Pt reported some flat boxes fell behind her yesterday and she tripped on them and hit posterior R upper arm on metal table. She reports site is tender to palpation but not painful. Bruising in oblong shape approx 3"x1". She sts she applied CBD salve to site at home this morning and this helped with some of the pain. Pt has no concerns related to injury. Advised to f/u with RN if site or pain worsens or does not improve over next 3-4 days. She verbalizes understanding and agreement with plan of care. Incident report completed by supervisor and HR aware pt has been evaluated.

## 2019-04-29 ENCOUNTER — Encounter: Payer: Self-pay | Admitting: Registered Nurse

## 2019-04-29 ENCOUNTER — Telehealth: Payer: Self-pay | Admitting: Registered Nurse

## 2019-04-29 NOTE — Telephone Encounter (Signed)
Patient presented to clinic today for follow up.  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  Patient had initially seen RN Haley abrasion and ecchymosis noted but no functional defects.  Patient wanted to show me lump today but she could not find it when palpating herself or when I palpated upper arm where she last remembered feeling it.  Patient denied pain/bruising/swelling/weakness/numbness right upper arm.  Full AROM and strength 5/5 bilaterally equal upper and lower extremities.  Discussed with patient after a fall/contusion there can be localized muscular swelling and/or a hematoma that will resolve gradually over time and this probably was what she was feeling and it has since resolved. She is to follow up for re-evaluation if she notes a return of lump. Patient verbalized understanding information/instructions, agreed with plan of care and had no further questions at this time.

## 2019-06-20 DIAGNOSIS — R638 Other symptoms and signs concerning food and fluid intake: Secondary | ICD-10-CM | POA: Insufficient documentation

## 2020-05-03 ENCOUNTER — Ambulatory Visit: Payer: Self-pay | Admitting: *Deleted

## 2020-05-03 ENCOUNTER — Other Ambulatory Visit: Payer: Self-pay

## 2020-05-03 VITALS — Wt 179.0 lb

## 2020-05-03 DIAGNOSIS — Z0000 Encounter for general adult medical examination without abnormal findings: Secondary | ICD-10-CM

## 2020-05-03 NOTE — Progress Notes (Signed)
Labs per pt request. Reports she had labs drawn over one year ago at pcp but has upcoming CPE and would like them completed again prior to that. Last labs on file with clinic from June 2018.

## 2020-05-05 LAB — CMP12+LP+TP+TSH+6AC+CBC/D/PLT
ALT: 36 IU/L — ABNORMAL HIGH (ref 0–32)
AST: 36 IU/L (ref 0–40)
Albumin/Globulin Ratio: 1.7 (ref 1.2–2.2)
Albumin: 4.8 g/dL (ref 3.8–4.9)
Alkaline Phosphatase: 93 IU/L (ref 44–121)
BUN/Creatinine Ratio: 10 (ref 9–23)
BUN: 9 mg/dL (ref 6–24)
Basophils Absolute: 0.1 10*3/uL (ref 0.0–0.2)
Basos: 1 %
Bilirubin Total: 0.2 mg/dL (ref 0.0–1.2)
Calcium: 9.8 mg/dL (ref 8.7–10.2)
Chloride: 99 mmol/L (ref 96–106)
Chol/HDL Ratio: 2.9 ratio (ref 0.0–4.4)
Cholesterol, Total: 166 mg/dL (ref 100–199)
Creatinine, Ser: 0.86 mg/dL (ref 0.57–1.00)
EOS (ABSOLUTE): 0.4 10*3/uL (ref 0.0–0.4)
Eos: 3 %
Estimated CHD Risk: <0.5 times avg. (ref 0.0–1.0)
Free Thyroxine Index: 2.1 (ref 1.2–4.9)
GFR calc Af Amer: 86 mL/min/{1.73_m2} (ref >59)
GFR calc non Af Amer: 74 mL/min/{1.73_m2} (ref >59)
GGT: 16 IU/L (ref 0–60)
Globulin, Total: 2.9 g/dL (ref 1.5–4.5)
Glucose: 87 mg/dL (ref 65–99)
HDL: 57 mg/dL (ref >39)
Hematocrit: 46 % (ref 34.0–46.6)
Hemoglobin: 14.4 g/dL (ref 11.1–15.9)
Immature Grans (Abs): 0 10*3/uL (ref 0.0–0.1)
Immature Granulocytes: 0 %
Iron: 92 ug/dL (ref 27–159)
LDH: 316 IU/L — ABNORMAL HIGH (ref 119–226)
LDL Chol Calc (NIH): 91 mg/dL (ref 0–99)
Lymphocytes Absolute: 5.4 10*3/uL — ABNORMAL HIGH (ref 0.7–3.1)
Lymphs: 39 %
MCH: 30.7 pg (ref 26.6–33.0)
MCHC: 31.3 g/dL — ABNORMAL LOW (ref 31.5–35.7)
MCV: 98 fL — ABNORMAL HIGH (ref 79–97)
Monocytes Absolute: 0.9 10*3/uL (ref 0.1–0.9)
Monocytes: 7 %
Neutrophils Absolute: 7 10*3/uL (ref 1.4–7.0)
Neutrophils: 50 %
Phosphorus: 5 mg/dL — ABNORMAL HIGH (ref 3.0–4.3)
Platelets: 334 10*3/uL (ref 150–450)
Potassium: 5.1 mmol/L (ref 3.5–5.2)
RBC: 4.69 x10E6/uL (ref 3.77–5.28)
RDW: 13.6 % (ref 11.7–15.4)
Sodium: 137 mmol/L (ref 134–144)
T3 Uptake Ratio: 29 % (ref 24–39)
T4, Total: 7.4 ug/dL (ref 4.5–12.0)
TSH: 1.02 u[IU]/mL (ref 0.450–4.500)
Total Protein: 7.7 g/dL (ref 6.0–8.5)
Triglycerides: 97 mg/dL (ref 0–149)
Uric Acid: 6.7 mg/dL (ref 3.0–7.2)
VLDL Cholesterol Cal: 18 mg/dL (ref 5–40)
WBC: 13.9 10*3/uL — ABNORMAL HIGH (ref 3.4–10.8)

## 2020-05-05 LAB — HGB A1C W/O EAG: Hgb A1c MFr Bld: 6.2 % — ABNORMAL HIGH (ref 4.8–5.6)

## 2020-05-06 ENCOUNTER — Encounter: Payer: Self-pay | Admitting: Registered Nurse

## 2020-05-06 ENCOUNTER — Telehealth: Payer: Self-pay | Admitting: Registered Nurse

## 2020-05-06 DIAGNOSIS — Z1211 Encounter for screening for malignant neoplasm of colon: Secondary | ICD-10-CM

## 2020-05-06 DIAGNOSIS — R7402 Elevation of levels of lactic acid dehydrogenase (LDH): Secondary | ICD-10-CM

## 2020-05-06 DIAGNOSIS — R7401 Elevation of levels of liver transaminase levels: Secondary | ICD-10-CM

## 2020-05-06 DIAGNOSIS — Z6833 Body mass index (BMI) 33.0-33.9, adult: Secondary | ICD-10-CM

## 2020-05-06 DIAGNOSIS — R7303 Prediabetes: Secondary | ICD-10-CM

## 2020-05-06 NOTE — Patient Instructions (Addendum)
High-Fiber Diet Fiber, also called dietary fiber, is a type of carbohydrate that is found in fruits, vegetables, whole grains, and beans. A high-fiber diet can have many health benefits. Your health care provider may recommend a high-fiber diet to help:  Prevent constipation. Fiber can make your bowel movements more regular.  Lower your cholesterol.  Relieve the following conditions: ? Swelling of veins in the anus (hemorrhoids). ? Swelling and irritation (inflammation) of specific areas of the digestive tract (uncomplicated diverticulosis). ? A problem of the large intestine (colon) that sometimes causes pain and diarrhea (irritable bowel syndrome, IBS).  Prevent overeating as part of a weight-loss plan.  Prevent heart disease, type 2 diabetes, and certain cancers. What is my plan? The recommended daily fiber intake in grams (g) includes:  38 g for men age 50 or younger.  30 g for men over age 50.  25 g for women age 50 or younger.  21 g for women over age 50. You can get the recommended daily intake of dietary fiber by:  Eating a variety of fruits, vegetables, grains, and beans.  Taking a fiber supplement, if it is not possible to get enough fiber through your diet. What do I need to know about a high-fiber diet?  It is better to get fiber through food sources rather than from fiber supplements. There is not a lot of research about how effective supplements are.  Always check the fiber content on the nutrition facts label of any prepackaged food. Look for foods that contain 5 g of fiber or more per serving.  Talk with a diet and nutrition specialist (dietitian) if you have questions about specific foods that are recommended or not recommended for your medical condition, especially if those foods are not listed below.  Gradually increase how much fiber you consume. If you increase your intake of dietary fiber too quickly, you may have bloating, cramping, or gas.  Drink plenty  of water. Water helps you to digest fiber. What are tips for following this plan?  Eat a wide variety of high-fiber foods.  Make sure that half of the grains that you eat each day are whole grains.  Eat breads and cereals that are made with whole-grain flour instead of refined flour or white flour.  Eat brown rice, bulgur wheat, or millet instead of white rice.  Start the day with a breakfast that is high in fiber, such as a cereal that contains 5 g of fiber or more per serving.  Use beans in place of meat in soups, salads, and pasta dishes.  Eat high-fiber snacks, such as berries, raw vegetables, nuts, and popcorn.  Choose whole fruits and vegetables instead of processed forms like juice or sauce. What foods can I eat?  Fruits Berries. Pears. Apples. Oranges. Avocado. Prunes and raisins. Dried figs. Vegetables Sweet potatoes. Spinach. Kale. Artichokes. Cabbage. Broccoli. Cauliflower. Green peas. Carrots. Squash. Grains Whole-grain breads. Multigrain cereal. Oats and oatmeal. Brown rice. Barley. Bulgur wheat. Millet. Quinoa. Bran muffins. Popcorn. Rye wafer crackers. Meats and other proteins Navy, kidney, and pinto beans. Soybeans. Split peas. Lentils. Nuts and seeds. Dairy Fiber-fortified yogurt. Beverages Fiber-fortified soy milk. Fiber-fortified orange juice. Other foods Fiber bars. The items listed above may not be a complete list of recommended foods and beverages. Contact a dietitian for more options. What foods are not recommended? Fruits Fruit juice. Cooked, strained fruit. Vegetables Fried potatoes. Canned vegetables. Well-cooked vegetables. Grains White bread. Pasta made with refined flour. White rice. Meats and other   proteins Fatty cuts of meat. Fried chicken or fried fish. Dairy Milk. Yogurt. Cream cheese. Sour cream. Fats and oils Butters. Beverages Soft drinks. Other foods Cakes and pastries. The items listed above may not be a complete list of foods  and beverages to avoid. Contact a dietitian for more information. Summary  Fiber is a type of carbohydrate. It is found in fruits, vegetables, whole grains, and beans.  There are many health benefits of eating a high-fiber diet, such as preventing constipation, lowering blood cholesterol, helping with weight loss, and reducing your risk of heart disease, diabetes, and certain cancers.  Gradually increase your intake of fiber. Increasing too fast can result in cramping, bloating, and gas. Drink plenty of water while you increase your fiber.  The best sources of fiber include whole fruits and vegetables, whole grains, nuts, seeds, and beans. This information is not intended to replace advice given to you by your health care provider. Make sure you discuss any questions you have with your health care provider. Document Revised: 06/04/2017 Document Reviewed: 06/04/2017 Elsevier Patient Education  2020 [employer]. Colorectal Cancer Screening  Colorectal cancer screening is a group of tests that are used to check for colorectal cancer before symptoms develop. Colorectal refers to the colon and rectum. The colon and rectum are located at the end of the digestive tract and carry bowel movements out of the body. Who should have screening? All adults starting at age 29 until age 64 should have screening. Your health care provider may recommend screening at age 103. You will have tests every 1-10 years, depending on your results and the type of screening test. You may have screening tests starting at an earlier age, or more frequently than other people, if you have any of the following risk factors:  A personal or family history of colorectal cancer or abnormal growths (polyps).  Inflammatory bowel disease, such as ulcerative colitis or Crohn's disease.  A history of having radiation treatment to the abdomen or pelvic area for cancer.  Colorectal cancer symptoms, such as changes in bowel habits or  blood in your stool.  A type of colon cancer syndrome that is passed from parent to child (hereditary), such as: ? Lynch syndrome. ? Familial adenomatous polyposis. ? Turcot syndrome. ? Peutz-Jeghers syndrome. Screening recommendations for adults who are 34-26 years old vary depending on health. How is screening done? There are several types of colorectal screening tests. You may have one or more of the following:  Guaiac-based fecal occult blood testing. For this test, a stool (feces) sample is checked for hidden (occult) blood, which could be a sign of colorectal cancer.  Fecal immunochemical test (FIT). For this test, a stool sample is checked for blood, which could be a sign of colorectal cancer.  Stool DNA test. For this test, a stool sample is checked for blood and changes in DNA that could lead to colorectal cancer.  Sigmoidoscopy. During this test, a thin, flexible tube with a camera on the end (sigmoidoscope) is used to examine the rectum and the lower colon.  Colonoscopy. During this test, a long, flexible tube with a camera on the end (colonoscope) is used to examine the entire colon and rectum. With a colonoscopy, it is possible to take a sample of tissue (biopsy) and remove small polyps during the test.  Virtual colonoscopy. Instead of a colonoscope, this type of colonoscopy uses X-rays (CT scan) and computers to produce images of the colon and rectum. What are the benefits  of screening? Screening reduces your risk for colorectal cancer and can help identify cancer at an early stage, when the cancer can be removed or treated more easily. It is common for polyps to form in the lining of the colon, especially as you age. These polyps may be cancerous or become cancerous over time. Screening can identify these polyps. What are the risks of screening? Each screening test may have different risks.  Stool sample tests have fewer risks than other types of screening tests. However,  you may need more tests to confirm results from a stool sample test.  Screening tests that involve X-rays expose you to low levels of radiation, which may slightly increase your cancer risk. The benefit of detecting cancer outweighs the slight increase in risk.  Screening tests such as sigmoidoscopy and colonoscopy may place you at risk for bleeding, intestinal damage, infection, or a reaction to medicines given during the exam. Talk with your health care provider to understand your risk for colorectal cancer and to make a screening plan that is right for you. Questions to ask your health care provider  When should I start colorectal cancer screening?  What is my risk for colorectal cancer?  How often do I need screening?  Which screening tests do I need?  How do I get my test results?  What do my results mean? Where to find more information Learn more about colorectal cancer screening from:  The American Cancer Society: www.cancer.org  The [employer]: www.cancer.gov Summary  Colorectal cancer screening is a group of tests used to check for colorectal cancer before symptoms develop.  Screening reduces your risk for colorectal cancer and can help identify cancer at an early stage, when the cancer can be removed or treated more easily.  All adults starting at age 58 until age 72 should have screening. Your health care provider may recommend screening at age 13.  You may have screening tests starting at an earlier age, or more frequently than other people, if you have certain risk factors.  Talk with your health care provider to understand your risk for colorectal cancer and to make a screening plan that is right for you. This information is not intended to replace advice given to you by your health care provider. Make sure you discuss any questions you have with your health care provider. Document Revised: 11/20/2018 Document Reviewed: 05/02/2017 Elsevier Patient  Education  2020 [employer]. Hyperphosphatemia Hyperphosphatemia is the condition of having too much phosphate in your blood. Phosphate forms when a mineral called phosphorus binds with oxygen. Phosphate is important because it binds with calcium to form and support your teeth and bones. However, if too much phosphate builds up in your blood, it binds to calcium in your blood and causes low calcium levels (hypocalcemia). Too much phosphate binding to calcium can also cause stones (calcifications) to build up in your body. This can damage your kidneys, heart, and blood vessels. What are the causes? The most common cause of this condition is kidney failure. A diseased kidney may not filter enough phosphate out of your blood. Other causes of hyperphosphatemia include:  Taking in too much phosphate. This can happen from taking too many laxatives or using enemas that contain phosphate. It can also result from receiving too much phosphate in IV fluids.  Increased absorption of phosphate. This can happen if you take too much vitamin D. Vitamin D causes more phosphate to be absorbed in the digestive tract.  Conditions that release  phosphate from inside the cells of the body (redistribution). This can happen from: ? Severe muscle injury, such as a crushing injury, or any condition that causes muscle cells to break down quickly. ? Cancer cells that die quickly. ? Severe infection. ? Poorly controlled diabetes.  Low parathyroid hormone. This hormone stimulates excretion of phosphate in the kidney. If it gets too low, phosphate can start to build up in the blood. What are the signs or symptoms? In most cases, there are no symptoms of this condition. Symptoms can develop if the condition leads to hypocalcemia or calcifications, or if it occurs along with kidney failure.  Signs of hypocalcemia include: ? Muscle cramps or spasms. ? Numbness and tingling around the mouth. ? Abnormal heart  rhythms. ? Seizures.  Signs of calcifications include: ? Hard bumps (nodules) under the skin. The nodules may cause an itchy skin rash. ? Joint pain.  Signs and symptoms of kidney failure include: ? Fatigue. ? Shortness of breath. ? Appetite loss. ? Nausea and vomiting. How is this diagnosed? This condition may be diagnosed based on a blood test to check whether you have a high level of phosphate. Your health care provider may suspect this condition based on your symptoms and medical history, especially if you have kidney disease or another condition that can cause hyperphosphatemia. The condition is sometimes found during a routine blood test. You may also have other tests, including:  Blood tests for calcium, parathyroid hormone, and kidney function.  Urine tests for phosphate.  Imaging studies to look for calcifications in skin, blood vessels, kidneys, or the heart.  A test to check for physical signs of hypocalcemia. These include increased reflexes and abnormal facial muscle contractions when tapping on the face (Chvostek's sign). How is this treated? Hyperphosphatemia can be reversed if the underlying condition is found and treated. If there is no kidney failure, the condition may improve without treatment. If there are symptoms of kidney failure, treatment may include:  IV fluids and medicines that increase urine flow (diuretics) to flush out the phosphate.  Filtering phosphate out of the blood with a procedure called dialysis.  Medicines that bind to phosphate to pull it out of the blood.  Limiting phosphorus or phosphate in the diet. Follow these instructions at home:   Take over-the-counter and prescription medicines only as told by your health care provider.  Follow instructions from your health care provider about eating or drinking restrictions.  Check food labels for phosphate or phosphorus.  You may need to work with a dietitian to lower the amount of  phosphorus in your diet. ? Foods that are low in phosphorus include fruits, vegetables, fresh or frozen meat, white rice, and white bread. ? Foods that are high in phosphorus include beer, chocolate, milk, cheese, liver, and oysters. Phosphorus is also used as a food additive and in many bottled beverages.  Drink enough fluid to keep your urine pale yellow.  Do not take vitamin D or phosphorus supplements.  Do not take laxatives or use enemas that contain phosphorus or phosphate. Contact a health care provider if:  You have signs of hypocalcemia.  You have itchy bumps under your skin.  You have joint pain.  You have signs or symptoms of kidney failure. Get help right away if:  You have a seizure.  You have chest pain.  You have difficulty breathing. Summary  Hyperphosphatemia is the condition of having too much phosphate in your blood.  Kidney failure is the most  common cause of this condition.  Most people do not have signs or symptoms of hyperphosphatemia unless it leads to another condition.  Hyperphosphatemia can lead to hypocalcemia and calcifications in the body.  Treatment for hyperphosphatemia may include IV fluids, filtering the blood with dialysis, medicine to bind phosphate, and limiting phosphorus in your diet. This information is not intended to replace advice given to you by your health care provider. Make sure you discuss any questions you have with your health care provider. Document Revised: 11/21/2018 Document Reviewed: 04/30/2017 Elsevier Patient Education  2020 Else[employer] Inc. Liver Function Tests Why am I having this test? Liver function tests are done to see how well your liver is working. The proteins and enzymes measured in the test can alert your health care provider to inflammation, damage, or disease in your liver. It is common to have liver function tests:  When you are taking certain medicines.  If you have liver disease.  If you drink a lot  of alcohol.  When you are not feeling well.  When you have other conditions that may affect your liver.  During annual physical exams.  If you have symptoms such as yellowing of the skin (jaundice), abdominal pain, or nausea and vomiting. What is being tested? These tests measure various substances in your blood. This may include:  Alanine transaminase (ALT). This is an enzyme in the liver.  Aspartate transaminase (AST). This is an enzyme in the liver, heart, and muscles.  Alkaline phosphatase (ALP). This is a protein in the liver, bile ducts, bone, and other body tissues.  Total bilirubin. This is a yellow pigment in bile.  Albumin. This is a protein in the liver.  Prothrombin time and international normalized ratio (PT and INR). PT measures the time it takes for your blood to clot. INR is a calculation of blood clotting time based on your PT result. It is also calculated based on normal ranges defined by the lab that processed your test.  Total protein. This includes two proteins, albumin and globulin, found in the blood. What kind of sample is taken?  A blood sample is required for this test. It is usually collected by inserting a needle into a blood vessel. How do I prepare for this test? How you prepare will depend on which tests are being done and the reason for doing them. You may need to:  Avoid eating for 4-6 hours before the test, or as told by your health care provider.  Stop taking certain medicines before your blood test, as told by your health care provider. Tell a health care provider about:  All medicines you are taking, including vitamins, herbs, eye drops, creams, and over-the-counter medicines.  Any medical conditions you have.  Whether you are pregnant or may be pregnant. How are the results reported? Your test results will be reported as values. Your health care provider will compare your results to normal ranges that were established after testing a  large group of people (reference ranges). Reference ranges may vary among labs and hospitals. For the substances measured in liver function tests, common reference ranges are: ALT  Infant: 10-40 international units/L.  Child or adult: 4-36 international units/L at 37C or 4-36 units/L (SI units).  Reference ranges may be higher for older adults. AST  Newborn 75-36 days old: 35-140 units/L.  Child younger than 67 years old: 15-60 units/L.  20-41 years old: 15-50 units/L.  61-51 years old: 10-50 units/L.  78-66 years old: 10-40 units/L.  Adult: 0-35 units/L or 0-0.58 microkatals/L (SI units).  Reference ranges may be higher for older adults. ALP  Child younger than 72 years old: 85-235 units/L.  30-14 years old: 65-210 units/L.  17-63 years old: 60-300 units/L.  31-64 years old: 30-200 units/L.  Adult: 30-120 units/L or 0.5-2.0 microkatals/L (SI units).  Reference ranges may be higher for older adults. Total bilirubin  Newborn: 1.0-12.0 mg/dL or 16.1-096 micromoles/L (SI units).  Child or adult: 0.3-1.0 mg/dL or 0.4-54 micromoles/L. Albumin  Premature infant: 3.0-4.2 g/dL.  Newborn: 3.5-5.4 g/dL.  Infant: 4.4-5.4 g/dL.  Child: 4.0-5.9 g/dL.  Adult: 3.5-5.0 g/dL or 09-81 g/L (SI units). PT  11.0-12.5 seconds; 85%-100%. INR  0.8-1.1. Total protein  Premature infant: 4.2-7.6 g/dL.  Newborn: 4.6-7.4 g/dL.  Infant: 6.0-6.7 g/dL.  Child: 6.2-8.0 g/dL.  Adult: 6.4-8.3 g/dL or 19-14 g/L (SI units). What do the results mean? Results that are within the reference ranges are considered normal. For each substance measured, results outside the reference range can indicate various health issues. ALT  Levels above the normal range may indicate liver disease. AST  Levels above the normal range may indicate liver disease. Sometimes levels also increase after burns, surgery, heart attack, muscle damage, or seizure. ALP  Levels above the normal range may be seen in  biliary obstruction, liver diseases, bone disease, thyroid disease, tumors, fractures, leukemia, lymphoma, or several other conditions. People with blood type O or B may show higher levels after a fatty meal.  Levels below the normal range may indicate bone and teeth conditions, malnutrition, protein deficiency, or Wilson's disease. Total bilirubin  Levels above the normal range may indicate problems with the liver, gallbladder, or bile ducts. Albumin  Levels above the normal range may indicate dehydration. They may also be caused by a diet that is high in protein.  Levels below the normal range may indicate kidney disease, liver disease, or malabsorption of nutrients. PT and INR  Levels above the normal range mean that your blood is clotting slower than normal. This may be due to blood disorders, liver disorders, or low levels of vitamin K. Total protein  Levels above the normal range may be due to infection or other diseases.  Levels below the normal range may be due to an immune system disorder, bleeding, burns, kidney disorder, liver disease, trouble absorbing or getting nutrients, or other conditions that affect the intestines. Talk with your health care provider about what your results mean. Questions to ask your health care provider Ask your health care provider, or the department that is doing the test:  When will my results be ready?  How will I get my results?  What are my treatment options?  What other tests do I need?  What are my next steps? Summary  Liver function tests are done to see how well your liver is working.  These tests measure various proteins and enzymes in your blood. The results can alert your health care provider to inflammation, damage, or disease in your liver.  Talk with your health care provider about what your results mean. This information is not intended to replace advice given to you by your health care provider. Make sure you discuss any  questions you have with your health care provider. Document Revised: 03/20/2018 Document Reviewed: 05/15/2017 Elsevier Patient Education  2020 [employer]. Prediabetes Prediabetes is the condition of having a blood sugar (blood glucose) level that is higher than it should be, but not high enough for you to be diagnosed with  type 2 diabetes. Having prediabetes puts you at risk for developing type 2 diabetes (type 2 diabetes mellitus). Prediabetes may be called impaired glucose tolerance or impaired fasting glucose. Prediabetes usually does not cause symptoms. Your health care provider can diagnose this condition with blood tests. You may be tested for prediabetes if you are overweight and if you have at least one other risk factor for prediabetes. What is blood glucose, and how is it measured? Blood glucose refers to the amount of glucose in your bloodstream. Glucose comes from eating foods that contain sugars and starches (carbohydrates), which the body breaks down into glucose. Your blood glucose level may be measured in mg/dL (milligrams per deciliter) or mmol/L (millimoles per liter). Your blood glucose may be checked with one or more of the following blood tests:  A fasting blood glucose (FBG) test. You will not be allowed to eat (you will fast) for 8 hours or longer before a blood sample is taken. ? A normal range for FBG is 70-100 mg/dl (7.4-2.5 mmol/L).  An A1c (hemoglobin A1c) blood test. This test provides information about blood glucose control over the previous 2?52months.  An oral glucose tolerance test (OGTT). This test measures your blood glucose at two times: ? After fasting. This is your baseline level. ? Two hours after you drink a beverage that contains glucose. You may be diagnosed with prediabetes:  If your FBG is 100?125 mg/dL (9.5-6.3 mmol/L).  If your A1c level is 5.7?6.4%.  If your OGTT result is 140?199 mg/dL (8.7-56 mmol/L). These blood tests may be repeated to  confirm your diagnosis. How can this condition affect me? The pancreas produces a hormone (insulin) that helps to move glucose from the bloodstream into cells. When cells in the body do not respond properly to insulin that the body makes (insulin resistance), excess glucose builds up in the blood instead of going into cells. As a result, high blood glucose (hyperglycemia) can develop, which can cause many complications. Hyperglycemia is a symptom of prediabetes. Having high blood glucose for a long time is dangerous. Too much glucose in your blood can damage your nerves and blood vessels. Long-term damage can lead to complications from diabetes, which may include:  Heart disease.  Stroke.  Blindness.  Kidney disease.  Depression.  Poor circulation in the feet and legs, which could lead to surgical removal (amputation) in severe cases. What can increase my risk? Risk factors for prediabetes include:  Having a family member with type 2 diabetes.  Being overweight or obese.  Being older than age 8.  Being of [place], African-American, Hispanic/Latino, or Asian/Pacific Islander descent.  Having an inactive (sedentary) lifestyle.  Having a history of heart disease.  History of gestational diabetes or polycystic ovary syndrome (PCOS), in women.  Having low levels of good cholesterol (HDL-C) or high levels of blood fats (triglycerides).  Having high blood pressure. What actions can I take to prevent diabetes?      Be physically active. ? Do moderate-intensity physical activity for 30 or more minutes on 5 or more days of the week, or as much as told by your health care provider. This could be brisk walking, biking, or water aerobics. ? Ask your health care provider what activities are safe for you. A mix of physical activities may be best, such as walking, swimming, cycling, and strength training.  Lose weight as told by your health care provider. ? Losing 5-7% of your  body weight can reverse insulin resistance. ? Your  health care provider can determine how much weight loss is best for you and can help you lose weight safely.  Follow a healthy meal plan. This includes eating lean proteins, complex carbohydrates, fresh fruits and vegetables, low-fat dairy products, and healthy fats. ? Follow instructions from your health care provider about eating or drinking restrictions. ? Make an appointment to see a diet and nutrition specialist (registered dietitian) to help you create a healthy eating plan that is right for you.  Do not smoke or use any tobacco products, such as cigarettes, chewing tobacco, and e-cigarettes. If you need help quitting, ask your health care provider.  Take over-the-counter and prescription medicines as told by your health care provider. You may be prescribed medicines that help lower the risk of type 2 diabetes.  Keep all follow-up visits as told by your health care provider. This is important. Summary  Prediabetes is the condition of having a blood sugar (blood glucose) level that is higher than it should be, but not high enough for you to be diagnosed with type 2 diabetes.  Having prediabetes puts you at risk for developing type 2 diabetes (type 2 diabetes mellitus).  To help prevent type 2 diabetes, make lifestyle changes such as being physically active and eating a healthy diet. Lose weight as told by your health care provider. This information is not intended to replace advice given to you by your health care provider. Make sure you discuss any questions you have with your health care provider. Document Revised: 11/22/2018 Document Reviewed: 09/21/2015 Elsevier Patient Education  2020 Elsevier Inc. Prediabetes Eating Plan Prediabetes is a condition that causes blood sugar (glucose) levels to be higher than normal. This increases the risk for developing diabetes. In order to prevent diabetes from developing, your health care provider  may recommend a diet and other lifestyle changes to help you:  Control your blood glucose levels.  Improve your cholesterol levels.  Manage your blood pressure. Your health care provider may recommend working with a diet and nutrition specialist (dietitian) to make a meal plan that is best for you. What are tips for following this plan? Lifestyle  Set weight loss goals with the help of your health care team. It is recommended that most people with prediabetes lose 7% of their current body weight.  Exercise for at least 30 minutes at least 5 days a week.  Attend a support group or seek ongoing support from a mental health counselor.  Take over-the-counter and prescription medicines only as told by your health care provider. Reading food labels  Read food labels to check the amount of fat, salt (sodium), and sugar in prepackaged foods. Avoid foods that have: ? Saturated fats. ? Trans fats. ? Added sugars.  Avoid foods that have more than 300 milligrams (mg) of sodium per serving. Limit your daily sodium intake to less than 2,300 mg each day. Shopping  Avoid buying pre-made and processed foods. Cooking  Cook with olive oil. Do not use butter, lard, or ghee.  Bake, broil, grill, or boil foods. Avoid frying. Meal planning   Work with your dietitian to develop an eating plan that is right for you. This may include: ? Tracking how many calories you take in. Use a food diary, notebook, or mobile application to track what you eat at each meal. ? Using the glycemic index (GI) to plan your meals. The index tells you how quickly a food will raise your blood glucose. Choose low-GI foods. These foods take a  longer time to raise blood glucose.  Consider following a Mediterranean diet. This diet includes: ? Several servings each day of fresh fruits and vegetables. ? Eating fish at least twice a week. ? Several servings each day of whole grains, beans, nuts, and seeds. ? Using olive oil  instead of other fats. ? Moderate alcohol consumption. ? Eating small amounts of red meat and whole-fat dairy.  If you have high blood pressure, you may need to limit your sodium intake or follow a diet such as the DASH eating plan. DASH is an eating plan that aims to lower high blood pressure. What foods are recommended? The items listed below may not be a complete list. Talk with your dietitian about what dietary choices are best for you. Grains Whole grains, such as whole-wheat or whole-grain breads, crackers, cereals, and pasta. Unsweetened oatmeal. Bulgur. Barley. Quinoa. Brown rice. Corn or whole-wheat flour tortillas or taco shells. Vegetables Lettuce. Spinach. Peas. Beets. Cauliflower. Cabbage. Broccoli. Carrots. Tomatoes. Squash. Eggplant. Herbs. Peppers. Onions. Cucumbers. Brussels sprouts. Fruits Berries. Bananas. Apples. Oranges. Grapes. Papaya. Mango. Pomegranate. Kiwi. Grapefruit. Cherries. Meats and other protein foods Seafood. Poultry without skin. Lean cuts of pork and beef. Tofu. Eggs. Nuts. Beans. Dairy Low-fat or fat-free dairy products, such as yogurt, cottage cheese, and cheese. Beverages Water. Tea. Coffee. Sugar-free or diet soda. Seltzer water. Lowfat or no-fat milk. Milk alternatives, such as soy or almond milk. Fats and oils Olive oil. Canola oil. Sunflower oil. Grapeseed oil. Avocado. Walnuts. Sweets and desserts Sugar-free or low-fat pudding. Sugar-free or low-fat ice cream and other frozen treats. Seasoning and other foods Herbs. Sodium-free spices. Mustard. Relish. Low-fat, low-sugar ketchup. Low-fat, low-sugar barbecue sauce. Low-fat or fat-free mayonnaise. What foods are not recommended? The items listed below may not be a complete list. Talk with your dietitian about what dietary choices are best for you. Grains Refined white flour and flour products, such as bread, pasta, snack foods, and cereals. Vegetables Canned vegetables. Frozen vegetables with  butter or cream sauce. Fruits Fruits canned with syrup. Meats and other protein foods Fatty cuts of meat. Poultry with skin. Breaded or fried meat. Processed meats. Dairy Full-fat yogurt, cheese, or milk. Beverages Sweetened drinks, such as sweet iced tea and soda. Fats and oils Butter. Lard. Ghee. Sweets and desserts Baked goods, such as cake, cupcakes, pastries, cookies, and cheesecake. Seasoning and other foods Spice mixes with added salt. Ketchup. Barbecue sauce. Mayonnaise. Summary  To prevent diabetes from developing, you may need to make diet and other lifestyle changes to help control blood sugar, improve cholesterol levels, and manage your blood pressure.  Set weight loss goals with the help of your health care team. It is recommended that most people with prediabetes lose 7 percent of their current body weight.  Consider following a Mediterranean diet that includes plenty of fresh fruits and vegetables, whole grains, beans, nuts, seeds, fish, lean meat, low-fat dairy, and healthy oils. This information is not intended to replace advice given to you by your health care provider. Make sure you discuss any questions you have with your health care provider. Document Revised: 11/22/2018 Document Reviewed: 10/04/2016 Elsevier Patient Education  2020 [employer]
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
## 2020-05-07 NOTE — Progress Notes (Signed)
NP reviewed labs with pt yesterday. T-con completed as GI referral completed for overdue colonoscopy.

## 2020-10-15 ENCOUNTER — Encounter: Payer: Self-pay | Admitting: Registered Nurse

## 2020-10-15 ENCOUNTER — Telehealth: Payer: Self-pay | Admitting: Registered Nurse

## 2020-10-15 DIAGNOSIS — T148XXA Other injury of unspecified body region, initial encounter: Secondary | ICD-10-CM

## 2020-10-15 NOTE — Telephone Encounter (Signed)
Notified by [facility] safety office patient had minor injury at work on 10/12/20 bumped into plastic tote skin abraded and fell right shin.  Attempted to follow up with patient yesterday but did not see in workcenter.  RN Gayleen Orem to call patient yesterday and today to follow up with patient.  RN Gayleen Orem stated left message x 2.

## 2020-10-21 NOTE — Telephone Encounter (Signed)
Reviewed RN Rolly Salter note agreed with plan of care.  Saw patient in hallway gait sure and steady and patient had no questions or concerns for me.

## 2020-10-21 NOTE — Telephone Encounter (Signed)
Pt by clinic today for wound check. 1 inch abrasion noted to R shin. No swelling, erythema, pain. Healing well. No concerns. Suggested applying aquaphor for moisture. Pt reports carpet burn like area on R knee from hitting floor mat. Unable to visualize due to tight pant legs. Pt reports it is healing well also and has no concerns regarding it. S/sx infection reviewed. Pt denies further questions/concerns.

## 2020-11-02 ENCOUNTER — Telehealth: Payer: Self-pay | Admitting: Registered Nurse

## 2020-11-02 ENCOUNTER — Encounter: Payer: Self-pay | Admitting: Registered Nurse

## 2020-11-02 DIAGNOSIS — T148XXA Other injury of unspecified body region, initial encounter: Secondary | ICD-10-CM

## 2020-11-02 MED ORDER — TRIPLE ANTIBIOTIC 5-400-5000 EX OINT: TOPICAL_OINTMENT | Freq: Two times a day (BID) | CUTANEOUS | 0 refills | Status: AC | PRN

## 2020-11-02 NOTE — Telephone Encounter (Signed)
Patient reported splinter in finger since yesterday thought she got it all removed but can see a black spot this am and hurting at work right index finger.  Here for removal in clinic.  Antiseptic applied and splinter out utilized to remove remaining wood splinter in 2 pieces 32mm each linear.  Patient reported pain resolved.  Drop of blood after procedure.  First aid spray, triple antibiotic and bandaid applied and patient given UD triple antibiotic and bandaids to use for dressing changes this week from clinic stock. Wash with soap and water at least daily. Apply triple antibiotic to wound site until healed typically days and keep covered with bandaid until healed. Monitor for infection e.g. swelling, discharge, redness spreading, worsening pain and notify clinic staff if this occurs.  Patient reported all pain resolved prior to leaving clinic.  Bandaid clean and dry.  Patient ambulatory gait steady and sure respirations even and unlabored room air skin warm dry and pink.  Patient verbalized understanding information/instructions, agreed with plan of care and had no further questions at this time.

## 2021-02-10 ENCOUNTER — Other Ambulatory Visit: Payer: Self-pay

## 2021-02-10 ENCOUNTER — Ambulatory Visit: Payer: Self-pay | Admitting: *Deleted

## 2021-02-10 VITALS — BP 180/90 | Ht 61.0 in | Wt 169.0 lb

## 2021-02-10 DIAGNOSIS — Z0000 Encounter for general adult medical examination without abnormal findings: Secondary | ICD-10-CM

## 2021-02-10 NOTE — Progress Notes (Addendum)
Be Well insurance premium discount evaluation: Labs Drawn. [facility] ROI form signed. Tobacco Free Attestation form signed.  Forms placed in paper chart.   Okay to route results to pcp per pt request.   Discussed bp with pt. BP has been elevated since 140/88 in 2017 and steadily worsening. Reports it is normal at her doctor's office as far as she recalls. She is going to call them after the holiday and make an appt. Pt to notify RN of appt details and RN will provide pt with bp log for previous few years to review with provider.

## 2021-02-11 LAB — CMP12+LP+TP+TSH+6AC+CBC/D/PLT
ALT: 27 IU/L (ref 0–32)
AST: 32 IU/L (ref 0–40)
Albumin/Globulin Ratio: 1.8 (ref 1.2–2.2)
Albumin: 4.5 g/dL (ref 3.8–4.9)
Alkaline Phosphatase: 79 IU/L (ref 44–121)
BUN/Creatinine Ratio: 12 (ref 12–28)
BUN: 9 mg/dL (ref 8–27)
Basophils Absolute: 0.1 10*3/uL (ref 0.0–0.2)
Basos: 1 %
Bilirubin Total: 0.4 mg/dL (ref 0.0–1.2)
Calcium: 9.3 mg/dL (ref 8.7–10.3)
Chloride: 103 mmol/L (ref 96–106)
Chol/HDL Ratio: 2.9 ratio (ref 0.0–4.4)
Cholesterol, Total: 155 mg/dL (ref 100–199)
Creatinine, Ser: 0.74 mg/dL (ref 0.57–1.00)
EOS (ABSOLUTE): 0.2 10*3/uL (ref 0.0–0.4)
Eos: 3 %
Estimated CHD Risk: <0.5 times avg. (ref 0.0–1.0)
Free Thyroxine Index: 2 (ref 1.2–4.9)
GGT: 12 IU/L (ref 0–60)
Globulin, Total: 2.5 g/dL (ref 1.5–4.5)
Glucose: 80 mg/dL (ref 65–99)
HDL: 53 mg/dL (ref >39)
Hematocrit: 42.2 % (ref 34.0–46.6)
Hemoglobin: 14.3 g/dL (ref 11.1–15.9)
Immature Grans (Abs): 0 10*3/uL (ref 0.0–0.1)
Immature Granulocytes: 0 %
Iron: 106 ug/dL (ref 27–159)
LDH: 297 IU/L — ABNORMAL HIGH (ref 119–226)
LDL Chol Calc (NIH): 85 mg/dL (ref 0–99)
Lymphocytes Absolute: 3.2 10*3/uL — ABNORMAL HIGH (ref 0.7–3.1)
Lymphs: 37 %
MCH: 31.2 pg (ref 26.6–33.0)
MCHC: 33.9 g/dL (ref 31.5–35.7)
MCV: 92 fL (ref 79–97)
Monocytes Absolute: 0.7 10*3/uL (ref 0.1–0.9)
Monocytes: 8 %
Neutrophils Absolute: 4.6 10*3/uL (ref 1.4–7.0)
Neutrophils: 51 %
Phosphorus: 3.7 mg/dL (ref 3.0–4.3)
Platelets: 313 10*3/uL (ref 150–450)
Potassium: 4.4 mmol/L (ref 3.5–5.2)
RBC: 4.58 x10E6/uL (ref 3.77–5.28)
RDW: 13.7 % (ref 11.7–15.4)
Sodium: 139 mmol/L (ref 134–144)
T3 Uptake Ratio: 28 % (ref 24–39)
T4, Total: 7.2 ug/dL (ref 4.5–12.0)
TSH: 1.07 u[IU]/mL (ref 0.450–4.500)
Total Protein: 7 g/dL (ref 6.0–8.5)
Triglycerides: 93 mg/dL (ref 0–149)
Uric Acid: 6.3 mg/dL (ref 3.0–7.2)
VLDL Cholesterol Cal: 17 mg/dL (ref 5–40)
WBC: 8.8 10*3/uL (ref 3.4–10.8)
eGFR: 93 mL/min/{1.73_m2} (ref >59)

## 2021-02-11 LAB — HGB A1C W/O EAG: Hgb A1c MFr Bld: 5.8 % — ABNORMAL HIGH (ref 4.8–5.6)

## 2021-08-31 ENCOUNTER — Other Ambulatory Visit: Payer: Self-pay | Admitting: Internal Medicine

## 2021-08-31 DIAGNOSIS — Z1231 Encounter for screening mammogram for malignant neoplasm of breast: Secondary | ICD-10-CM

## 2021-09-05 ENCOUNTER — Other Ambulatory Visit: Payer: Self-pay

## 2021-09-05 ENCOUNTER — Ambulatory Visit
Admission: RE | Admit: 2021-09-05 | Discharge: 2021-09-05 | Disposition: A | Discharge status: Home / Self Care | Payer: No Typology Code available for payment source | Source: Ambulatory Visit | Attending: Internal Medicine | Admitting: Internal Medicine

## 2021-09-05 DIAGNOSIS — Z1231 Encounter for screening mammogram for malignant neoplasm of breast: Secondary | ICD-10-CM

## 2021-10-10 ENCOUNTER — Ambulatory Visit: Payer: Self-pay | Admitting: *Deleted

## 2021-10-10 DIAGNOSIS — S99911A Unspecified injury of right ankle, initial encounter: Secondary | ICD-10-CM

## 2021-10-10 NOTE — Progress Notes (Signed)
Pt in to clinic with Psychologist, occupational. She reports an injury to R ankle while working today. Occurred approx 2p or 1.5hrs PTA. Was using locking mechanism and foot slipped off and straight edge of lock hit her anterior ankle. Slight swelling noted just above R medial malleolus as compared to L ankle. No bruising, erythema, warmth present. Pt ambulatory without difficulty, without limp or compensation visible, into clinic.  Do not believe imaging or external evaluation is indicated at this time. Pt agrees. Wrapped with ace bandage. Discussed elevation. Pt plans to soak in epsom salt soak tonight. Discussed ice if swelling worsens. Provided with Tylenol per her preference for pain and swelling. RTC tomorrow for recheck. Pt agreeable and denies questions or concerns.

## 2021-12-27 ENCOUNTER — Ambulatory Visit: Payer: Self-pay | Admitting: *Deleted

## 2021-12-27 ENCOUNTER — Other Ambulatory Visit: Payer: Self-pay | Admitting: Registered Nurse

## 2021-12-27 VITALS — BP 196/98 | Ht 60.5 in | Wt 158.0 lb

## 2021-12-28 ENCOUNTER — Telehealth: Payer: Self-pay | Admitting: Registered Nurse

## 2021-12-28 DIAGNOSIS — R7303 Prediabetes: Secondary | ICD-10-CM

## 2021-12-28 DIAGNOSIS — E559 Vitamin D deficiency, unspecified: Secondary | ICD-10-CM

## 2021-12-28 LAB — CMP12+LP+TP+TSH+6AC+CBC/D/PLT
ALT: 21 IU/L (ref 0–32)
AST: 29 IU/L (ref 0–40)
Albumin/Globulin Ratio: 1.7 (ref 1.2–2.2)
Albumin: 4.5 g/dL (ref 3.8–4.8)
Alkaline Phosphatase: 76 IU/L (ref 44–121)
BUN/Creatinine Ratio: 14 (ref 12–28)
BUN: 10 mg/dL (ref 8–27)
Basophils Absolute: 0.2 10*3/uL (ref 0.0–0.2)
Basos: 2 %
Bilirubin Total: 0.3 mg/dL (ref 0.0–1.2)
Calcium: 9.8 mg/dL (ref 8.7–10.3)
Chloride: 101 mmol/L (ref 96–106)
Chol/HDL Ratio: 3.2 ratio (ref 0.0–4.4)
Cholesterol, Total: 185 mg/dL (ref 100–199)
Creatinine, Ser: 0.72 mg/dL (ref 0.57–1.00)
EOS (ABSOLUTE): 0.3 10*3/uL (ref 0.0–0.4)
Eos: 3 %
Estimated CHD Risk: <0.5 times avg. (ref 0.0–1.0)
Free Thyroxine Index: 2 (ref 1.2–4.9)
GGT: 13 IU/L (ref 0–60)
Globulin, Total: 2.7 g/dL (ref 1.5–4.5)
Glucose: 82 mg/dL (ref 70–99)
HDL: 57 mg/dL (ref >39)
Hematocrit: 41.5 % (ref 34.0–46.6)
Hemoglobin: 14.3 g/dL (ref 11.1–15.9)
Immature Grans (Abs): 0 10*3/uL (ref 0.0–0.1)
Immature Granulocytes: 0 %
Iron: 134 ug/dL (ref 27–139)
LDH: 288 IU/L — ABNORMAL HIGH (ref 119–226)
LDL Chol Calc (NIH): 110 mg/dL — ABNORMAL HIGH (ref 0–99)
Lymphocytes Absolute: 4.1 10*3/uL — ABNORMAL HIGH (ref 0.7–3.1)
Lymphs: 40 %
MCH: 32.1 pg (ref 26.6–33.0)
MCHC: 34.5 g/dL (ref 31.5–35.7)
MCV: 93 fL (ref 79–97)
Monocytes Absolute: 0.7 10*3/uL (ref 0.1–0.9)
Monocytes: 7 %
Neutrophils Absolute: 5 10*3/uL (ref 1.4–7.0)
Neutrophils: 48 %
Phosphorus: 4.2 mg/dL (ref 3.0–4.3)
Platelets: 336 10*3/uL (ref 150–450)
Potassium: 4.7 mmol/L (ref 3.5–5.2)
RBC: 4.45 x10E6/uL (ref 3.77–5.28)
RDW: 13.4 % (ref 11.7–15.4)
Sodium: 138 mmol/L (ref 134–144)
T3 Uptake Ratio: 30 % (ref 24–39)
T4, Total: 6.8 ug/dL (ref 4.5–12.0)
TSH: 1.4 u[IU]/mL (ref 0.450–4.500)
Total Protein: 7.2 g/dL (ref 6.0–8.5)
Triglycerides: 100 mg/dL (ref 0–149)
Uric Acid: 5.6 mg/dL (ref 3.0–7.2)
VLDL Cholesterol Cal: 18 mg/dL (ref 5–40)
WBC: 10.4 10*3/uL (ref 3.4–10.8)
eGFR: 95 mL/min/{1.73_m2} (ref >59)

## 2021-12-28 LAB — VITAMIN D 25 HYDROXY (VIT D DEFICIENCY, FRACTURES): Vit D, 25-Hydroxy: 20.2 ng/mL — ABNORMAL LOW (ref 30.0–100.0)

## 2021-12-28 LAB — HGB A1C W/O EAG: Hgb A1c MFr Bld: 5.7 % — ABNORMAL HIGH (ref 4.8–5.6)

## 2021-12-28 MED ORDER — CHOLECALCIFEROL 1.25 MG (50000 UT) PO TABS: 1.0000 | ORAL_TABLET | ORAL | 0 refills | Status: AC

## 2022-01-02 NOTE — Progress Notes (Signed)
Be Well insurance premium discount evaluation: Labs Drawn by [facility] onsite. [facility] ROI form signed. Tobacco Free Attestation form signed.  Forms placed in paper chart.  

## 2022-01-03 NOTE — Telephone Encounter (Signed)
Late entry: Results reviewed with pt in clinic 5/19. Vitamin D low. Rx will be sent in. Pharmacy confirmed. Appt made for repeat Vit D in Aug 2023. Handouts provided. Diet and exercise recommendations discussed re: A1c, lipids. Routine f/u with pcp. Copy of labs provided to pt. No further questions/concerns.

## 2022-01-07 ENCOUNTER — Encounter: Payer: Self-pay | Admitting: Registered Nurse

## 2022-01-07 NOTE — Telephone Encounter (Signed)
Reviewed RN Rolly Salter note, follow up labs scheduled.  Rx was sent to her pharmacy of choice for cholecalciferol supplement.

## 2022-03-27 ENCOUNTER — Other Ambulatory Visit: Payer: Self-pay | Admitting: Registered Nurse

## 2022-03-27 DIAGNOSIS — Z8639 Personal history of other endocrine, nutritional and metabolic disease: Secondary | ICD-10-CM

## 2022-03-28 LAB — VITAMIN D 25 HYDROXY (VIT D DEFICIENCY, FRACTURES): Vit D, 25-Hydroxy: 78.1 ng/mL (ref 30.0–100.0)

## 2022-03-28 MED ORDER — VITAMIN D3 50 MCG (2000 UT) PO CAPS: 2000.0000 [IU] | ORAL_CAPSULE | Freq: Every day | ORAL | Status: DC

## 2022-03-28 NOTE — Addendum Note (Signed)
Addended by: Albina Billet A on: 03/28/2022 12:37 PM   Modules accepted: Orders, Level of Service

## 2022-04-03 ENCOUNTER — Telehealth: Payer: Self-pay | Admitting: Registered Nurse

## 2022-04-03 ENCOUNTER — Encounter: Payer: Self-pay | Admitting: Registered Nurse

## 2022-04-03 DIAGNOSIS — Z8639 Personal history of other endocrine, nutritional and metabolic disease: Secondary | ICD-10-CM

## 2022-04-03 NOTE — Telephone Encounter (Signed)
Patient stopped NP in warehouse to discuss she bought [place] Vitamin K2 and D capsules 90 count.  Read vitamin K helps body to utilize vitamin D and prevent osteoporosis.  Reviewed NIH supplement website with patient and safe to take Vitamin K and D supplements at that dose.  I do recommend ISP/USP tested dietary supplements to ensure purity and not containing other medications/substances.  Patient verbalized understanding information/instructions, agreed with plan of care and had no further questions at this time.

## 2022-06-22 DIAGNOSIS — R030 Elevated blood-pressure reading, without diagnosis of hypertension: Secondary | ICD-10-CM | POA: Insufficient documentation

## 2022-10-09 ENCOUNTER — Ambulatory Visit: Payer: No Typology Code available for payment source | Admitting: Occupational Medicine

## 2022-10-09 ENCOUNTER — Encounter: Payer: Self-pay | Admitting: Registered Nurse

## 2022-10-09 ENCOUNTER — Telehealth: Payer: Self-pay | Admitting: Registered Nurse

## 2022-10-09 DIAGNOSIS — K0889 Other specified disorders of teeth and supporting structures: Secondary | ICD-10-CM

## 2022-10-09 DIAGNOSIS — R7303 Prediabetes: Secondary | ICD-10-CM

## 2022-10-09 DIAGNOSIS — Z0000 Encounter for general adult medical examination without abnormal findings: Secondary | ICD-10-CM

## 2022-10-09 DIAGNOSIS — E559 Vitamin D deficiency, unspecified: Secondary | ICD-10-CM

## 2022-10-09 NOTE — Progress Notes (Signed)
Patient reports having tooth pain needing oral gel. Patient given oral gel for numbing and ibuprofen for inflammation. Educated if still bothering her and no dentist appt yet can have her see NP tomorrow.

## 2022-10-09 NOTE — Telephone Encounter (Signed)
Appt 10/12/22 orders fasting placed Vitamin D, Hgba1c, female executive panel RN Kimrey notified

## 2022-10-12 ENCOUNTER — Other Ambulatory Visit: Payer: No Typology Code available for payment source | Admitting: Occupational Medicine

## 2022-10-12 DIAGNOSIS — Z0000 Encounter for general adult medical examination without abnormal findings: Secondary | ICD-10-CM

## 2022-10-12 NOTE — Progress Notes (Signed)
Lab drawn  tolerated well no issues noted.

## 2022-10-13 LAB — CMP12+LP+TP+TSH+6AC+CBC/D/PLT
ALT: 22 IU/L (ref 0–32)
AST: 27 IU/L (ref 0–40)
Albumin/Globulin Ratio: 1.7 (ref 1.2–2.2)
Albumin: 4.7 g/dL (ref 3.9–4.9)
Alkaline Phosphatase: 89 IU/L (ref 44–121)
BUN/Creatinine Ratio: 15 (ref 12–28)
BUN: 12 mg/dL (ref 8–27)
Basophils Absolute: 0.1 10*3/uL (ref 0.0–0.2)
Basos: 1 %
Bilirubin Total: 0.5 mg/dL (ref 0.0–1.2)
Calcium: 9.6 mg/dL (ref 8.7–10.3)
Chloride: 101 mmol/L (ref 96–106)
Chol/HDL Ratio: 2.8 ratio (ref 0.0–4.4)
Cholesterol, Total: 175 mg/dL (ref 100–199)
Creatinine, Ser: 0.82 mg/dL (ref 0.57–1.00)
EOS (ABSOLUTE): 0.3 10*3/uL (ref 0.0–0.4)
Eos: 3 %
Estimated CHD Risk: <0.5 times avg. (ref 0.0–1.0)
Free Thyroxine Index: 2.2 (ref 1.2–4.9)
GGT: 12 IU/L (ref 0–60)
Globulin, Total: 2.7 g/dL (ref 1.5–4.5)
Glucose: 87 mg/dL (ref 70–99)
HDL: 62 mg/dL (ref >39)
Hematocrit: 44.5 % (ref 34.0–46.6)
Hemoglobin: 14.6 g/dL (ref 11.1–15.9)
Immature Grans (Abs): 0 10*3/uL (ref 0.0–0.1)
Immature Granulocytes: 0 %
Iron: 142 ug/dL — ABNORMAL HIGH (ref 27–139)
LDH: 272 IU/L — ABNORMAL HIGH (ref 119–226)
LDL Chol Calc (NIH): 97 mg/dL (ref 0–99)
Lymphocytes Absolute: 3.6 10*3/uL — ABNORMAL HIGH (ref 0.7–3.1)
Lymphs: 34 %
MCH: 30.9 pg (ref 26.6–33.0)
MCHC: 32.8 g/dL (ref 31.5–35.7)
MCV: 94 fL (ref 79–97)
Monocytes Absolute: 0.8 10*3/uL (ref 0.1–0.9)
Monocytes: 8 %
Neutrophils Absolute: 5.8 10*3/uL (ref 1.4–7.0)
Neutrophils: 54 %
Phosphorus: 4.6 mg/dL — ABNORMAL HIGH (ref 3.0–4.3)
Platelets: 401 10*3/uL (ref 150–450)
Potassium: 4.9 mmol/L (ref 3.5–5.2)
RBC: 4.73 x10E6/uL (ref 3.77–5.28)
RDW: 14 % (ref 11.7–15.4)
Sodium: 139 mmol/L (ref 134–144)
T3 Uptake Ratio: 29 % (ref 24–39)
T4, Total: 7.5 ug/dL (ref 4.5–12.0)
TSH: 1.13 u[IU]/mL (ref 0.450–4.500)
Total Protein: 7.4 g/dL (ref 6.0–8.5)
Triglycerides: 85 mg/dL (ref 0–149)
Uric Acid: 6.3 mg/dL (ref 3.0–7.2)
VLDL Cholesterol Cal: 16 mg/dL (ref 5–40)
WBC: 10.6 10*3/uL (ref 3.4–10.8)
eGFR: 81 mL/min/{1.73_m2} (ref >59)

## 2022-10-13 LAB — VITAMIN D 25 HYDROXY (VIT D DEFICIENCY, FRACTURES): Vit D, 25-Hydroxy: 29.6 ng/mL — ABNORMAL LOW (ref 30.0–100.0)

## 2022-10-13 LAB — HEMOGLOBIN A1C
Est. average glucose Bld gHb Est-mCnc: 120 mg/dL
Hgb A1c MFr Bld: 5.8 % — ABNORMAL HIGH (ref 4.8–5.6)

## 2022-10-14 ENCOUNTER — Encounter: Payer: Self-pay | Admitting: Registered Nurse

## 2022-10-14 MED ORDER — VITAMIN D3 50 MCG (2000 UT) PO CAPS: 4000.0000 [IU] | ORAL_CAPSULE | ORAL | 11 refills | Status: AC

## 2022-10-14 MED ORDER — VITAMIN D3 50 MCG (2000 UT) PO CAPS: 2000.0000 [IU] | ORAL_CAPSULE | ORAL | 11 refills | Status: AC

## 2022-10-14 NOTE — Addendum Note (Signed)
Addended by: Gerarda Fraction A on: 10/14/2022 07:31 PM   Modules accepted: Orders

## 2022-10-14 NOTE — Progress Notes (Signed)
My chart message sent to patient Regina Hutchinson, Your phosphorus, LDH, Iron, lymphocytes and 3 month blood sugar average (HgbA1c) were elevated.  Your vitamin D level was low.  Follow up with [facility] for these findings.   Exitcare handouts on vitamin D deficiency, prediabetes, prediabetes diet, calorie counting for weight loss, iron, phosphorus and [employer] dietitian information sent to your my chart account.  The dietitian offers free visits. Link to make appt  [facility] ([email or web address]) I recommend 15 minutes sunlight on skin daily and increase your cholecalciferol to 3000 units daily or take 1 2000 tab one day and alternate day 4000 (2 tabs) with fattiest meal for best absorption and repeat level in 12 months not fasting.  Repeat iron and Hgba1c in 3 months.  Are you on well or city water?  Bottled water your main source of drinking water?  Any iron supplement in your multivitamin?   I recommend weight loss 5-10 lbs, exercise 150 minutes per week; 20 grams women per up to date; eat whole grains/fruits/vegetables; keep added sugars to less than 100 calories/ 5 teaspoons for women per American Heart Association;  electrolytes, kidney/liver/thyroid function and complete blood count normal. If you prefer printed copies of results and/or handouts please see RN Kimrey.  Please let us know if you have further questions or concerns. Please see RN Evlyn Kanner to sign your Be Well paperwork you met LDL less than 130 and Hgba1c less than 7 requirements for insurance discount.  Sincerely,   Gerarda Fraction NP-C

## 2022-10-18 NOTE — Progress Notes (Signed)
Noted well and bottled water new iron level elevation.  Message left for patient asking if new home/well or switched brands of bottled water recently.  RN Kimrey reviewed Be Well results with patient in clinic 10/17/2022.

## 2022-10-23 ENCOUNTER — Ambulatory Visit: Payer: No Typology Code available for payment source | Admitting: Occupational Medicine

## 2022-10-23 VITALS — BP 144/90 | Ht 61.0 in | Wt 154.1 lb

## 2022-10-23 DIAGNOSIS — E559 Vitamin D deficiency, unspecified: Secondary | ICD-10-CM

## 2022-10-23 DIAGNOSIS — Z0000 Encounter for general adult medical examination without abnormal findings: Secondary | ICD-10-CM

## 2022-10-23 DIAGNOSIS — R7303 Prediabetes: Secondary | ICD-10-CM

## 2022-10-24 NOTE — Progress Notes (Signed)
10/23/22  at 1545  Your phosphorus, LDH, Iron, lymphocytes and 3 month blood sugar average (HgbA1c) were elevated.  Your vitamin D level was low.  Follow up with [facility] for these findings.   Exitcare handouts on vitamin D deficiency, prediabetes, prediabetes diet, calorie counting for weight loss, iron, phosphorus and [employer] dietitian information sent to your my chart account.  The dietitian offers free visits. Link to make appt  [employer] ([email or web address])  emailed link to the patient. NP recommend 15 minutes sunlight on skin daily and increase your cholecalciferol to 3000 units daily or take 1 2000 tab one day and alternate day 4000 (2 tabs) with fattiest meal for best absorption and repeat level in 12 months not fasting.  Repeat iron and Hgba1c in 3 months.  NP recommend weight loss 5-10 lbs, exercise 150 minutes per week; 20 grams women per up to date; eat whole grains/fruits/vegetables; keep added sugars to less than 100 calories/ 5 teaspoons for women per American Heart Association;  electrolytes, kidney/liver/thyroid function and complete blood count normal. Please let us know if you have further questions or concerns.   Be well insurance premium discount evaluation:   Epic reviewed by RN Evlyn Kanner and transcribed labs.  Tobacco attestation signed. [facility] ROI formed signed. Forms placed in the chart.   Patient given handouts for [facility] pharmacies and discount drugs list,MyChart, Tele doc setup, Tele doc Behavioral, [place] counseling and [facility] counseling.  What to do for infectious illness protocol. Given handout for list of medications that can be filled at [facility]. Given Clinic hours and Clinic Email.   Scheduled follow up labs.

## 2023-01-17 ENCOUNTER — Other Ambulatory Visit: Payer: No Typology Code available for payment source | Admitting: Occupational Medicine

## 2023-01-18 ENCOUNTER — Other Ambulatory Visit: Payer: No Typology Code available for payment source | Admitting: Occupational Medicine

## 2023-01-18 DIAGNOSIS — R7303 Prediabetes: Secondary | ICD-10-CM

## 2023-01-18 NOTE — Progress Notes (Signed)
Lab drawn from Left AC tolerated well no issues noted.   

## 2023-01-19 ENCOUNTER — Other Ambulatory Visit: Payer: Self-pay | Admitting: Registered Nurse

## 2023-01-19 DIAGNOSIS — R7303 Prediabetes: Secondary | ICD-10-CM

## 2023-01-19 LAB — IRON: Iron: 138 ug/dL (ref 27–139)

## 2023-01-19 LAB — HEMOGLOBIN A1C
Est. average glucose Bld gHb Est-mCnc: 123 mg/dL
Hgb A1c MFr Bld: 5.9 % — ABNORMAL HIGH (ref 4.8–5.6)

## 2023-01-28 NOTE — Progress Notes (Signed)
Noted follow up labs have been completed 

## 2023-03-11 ENCOUNTER — Other Ambulatory Visit: Payer: Self-pay | Admitting: Registered Nurse

## 2023-03-11 DIAGNOSIS — Z8639 Personal history of other endocrine, nutritional and metabolic disease: Secondary | ICD-10-CM | POA: Insufficient documentation

## 2023-03-11 NOTE — Progress Notes (Signed)
Epic reviewed patient met with RN Bess Kinds 10/23/22 discussed lab results/recommendation Be Well LDL 97 BP 144/90 Hgba1c 5.8 met requirements for insurance discount FY 2025 NP signed provider paperwork 10/24/22 RN Kimrey notified HR team My chart message sent to pateint Regina Hutchinson, Vitamin D levels will no longer be drawn at [facility] due to national guideline changes.  Continue to take cholecalciferol 2000 units by mouth daily with a meal.  Next labs due 2025 for Be Well 2026.  Please let us know if you have further questions.  Albina Billet NP-C

## 2023-04-18 ENCOUNTER — Other Ambulatory Visit: Payer: No Typology Code available for payment source

## 2023-08-13 ENCOUNTER — Telehealth: Payer: Self-pay | Admitting: Registered Nurse

## 2023-08-13 ENCOUNTER — Encounter: Payer: Self-pay | Admitting: Registered Nurse

## 2023-08-13 DIAGNOSIS — J201 Acute bronchitis due to Hemophilus influenzae: Secondary | ICD-10-CM

## 2023-08-13 DIAGNOSIS — J111 Influenza due to unidentified influenza virus with other respiratory manifestations: Secondary | ICD-10-CM

## 2023-08-13 MED ORDER — PREDNISONE 10 MG PO TABS: ORAL_TABLET | ORAL | 0 refills | Status: AC

## 2023-08-13 MED ORDER — OSELTAMIVIR PHOSPHATE 75 MG PO CAPS: 75.0000 mg | ORAL_CAPSULE | Freq: Two times a day (BID) | ORAL | 0 refills | Status: AC

## 2023-08-13 MED ORDER — FLUTICASONE PROPIONATE 50 MCG/ACT NA SUSP: 1.0000 | Freq: Every day | NASAL | 6 refills | Status: AC

## 2023-08-13 MED ORDER — ALBUTEROL SULFATE HFA 108 (90 BASE) MCG/ACT IN AERS: 1.0000 | INHALATION_SPRAY | RESPIRATORY_TRACT | 0 refills | Status: AC | PRN

## 2023-08-13 MED ORDER — SALINE SPRAY 0.65 % NA SOLN: 2.0000 | NASAL | Status: AC

## 2023-08-13 MED ORDER — PROMETHAZINE-DM 6.25-15 MG/5ML PO SYRP: 5.0000 mL | ORAL_SOLUTION | Freq: Four times a day (QID) | ORAL | 0 refills | Status: AC | PRN

## 2023-08-13 NOTE — Telephone Encounter (Signed)
Notified by RN Burna Mortimer patient out sick today complaints of body aches/fever ?flu.  Contacted patient via telephone. 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  Cough tenacious mucous yellow has to get out of bed to expel at night and interrupting sleep.  Chunky tan at times.  Discussed start amoxHas noticed wheezing at night.  Discussed communicable disease policy with patient e.g. my chart or email if fever/positive home covid test, diarrhea or vomiting within 24 hours of shift start.  Patient stated she was unaware of employer policy until notified by coworker.  Spoke with patient 10 minutes via telephone no audible cough/wheeze/shortness of breath or throat clearing.  Audible nasal congestion.  Using delsym cough syrup at night and wearing off half way through the night.  Hasn't tried honey.  Tolerating po intake with out difficulty.  Has had some sore throat.  Tamiflu 75mg  po BID x 5 days #10 RF0 Discussed use albuterol inhaler 122mcg/act 2 puffs po q4-6h prn chest tightness, wheezing, protracted coughing #1 RF0 sent to her pharmacy of choice.  Discussed instill 1 puff wait 5 minutes then do second puff. home.  Honey 1 tablespoon po q4h prn cough.  Did not want Bromphed DM  30/2/10/40ml take 5ml po q6h prn cough/rhinitis or prednisone.  Doesn't like how prednisone and sudafed make her feel.  Discussed sent in prednisone low dose taper in case cough/wheezing not responding to promethazine DM 5ml po q6h prn cough/n/v #120 RF0 electronic Rx sent to her pharmacy of choice discussed may cause drowsiness, avoid alcohol intake and driving/operating heavy machinery when taking it  Discussed flu typically body aches/fever.  Saw more cases in clinic last week.  GI bug and adenovirus and RSV also circulating in community typically lasting 10-14 days mucous clear to yellow to green then done.  Discussed need to dry up post nasal drip and will help with cough/sore  throat also.  Discussed hydrate with water to keep urine pale yellow clear and voiding every 2-4 hours while awake.  Patient may use normal saline nasal spray 2 sprays each nostril q2h wa as needed. flonase 1 spray each nostril BID #16gm RF6. Call or return to clinic as needed if these symptoms worsen or fail to improve as anticipated.   [employer]care handouts influenza/bronchitis, albuterol MDI  and sinus rinse sent to patient my chart. Consider starting augmentin if no improvement with plan of care x 48 hours.  Has tolerated amoxicillin for sinusitis in the past 2019.  Discussed with patient sent in prednisone Rx in case albuterol inhaler not resolving wheezing to start prednisone taper 30mg  x 2 days 20mg x2 days 10mg x2 days #12 RF0 with breakfast.   Patient verbalized understanding of instructions, agreed with plan of care and had no further questions at this time.  P2:  Avoidance and hand washing.  HR and supervisor notified excused absence 08/13/23 re-evaluation 08/14/23

## 2023-08-14 NOTE — Telephone Encounter (Signed)
 Patient reported was able to sleep a lot better with promethazine  DM voice sounds clearer today nasal congestion audible during 2 minute call no coughing spoke full sentences without difficulty A&Ox3 discussed re-evaluation tomorrow for RTW 08/16/2023 as employer closed 08/15/2023  Patient verbalized understanding information/instructions, agreed with plan of care and had no further questions at this time.

## 2023-08-15 NOTE — Telephone Encounter (Signed)
 Patient stated promethazine  DM has helped her to sleep at night.  Coughs more if she gets overheated.  Did not pick up prednisone  or albuterol  inhaler from pharmacy.  I didn't want to take them unless I was feeling worse and each day a little better just needed help to sleep/not cough at night.  Discussed with patient to come to clinic tomorrow for sp02 and lung sound check between 8a-5p with RN Apolinar and I would be in clinic 10a-1p  Patient with audible nasal congestion.  No cough/throat clearing/wheezing during 3 minute call.  Patient denied fever/chills/n/v/d in the past 48 hours.  Patient instructed to use normal call out procedures tomorrow am if unable to return to work.  Patient agreed with plan of care and had no further questions at this time.  HR and supervisor notified cleared to return onsite 08/16/2023

## 2023-08-30 NOTE — Telephone Encounter (Signed)
Patient seen in workcenter still using promethazine DM and albuterol inhaler.  Nasal congestion/sniffing observed.  Skin warm dry and pink respirations even and unlabored BBS CTA sp02 98% RA gait sure and steady spoke full sentences without difficulty.  Wearing disposable mask at work.  Discussed continue hydrating, covering mouth and nose when outside in freezing temperatures.  Continue medication prn  Notify clinic staff if new or worsening conditions.  Patient stated improving slowly from last week.  Sp02 stable from last week also and lung sounds unchanged was seen in workcenter.  Patient agreed with plan of care and had no further questions at this time.

## 2023-09-19 ENCOUNTER — Other Ambulatory Visit: Payer: No Typology Code available for payment source

## 2023-10-08 ENCOUNTER — Ambulatory Visit: Payer: No Typology Code available for payment source

## 2023-10-08 VITALS — BP 164/99 | HR 69

## 2023-10-08 DIAGNOSIS — R0981 Nasal congestion: Secondary | ICD-10-CM

## 2023-10-08 NOTE — Progress Notes (Signed)
 Pt reports nasal congestion and a dry cough. Pt denies headache at this time.

## 2023-12-11 ENCOUNTER — Telehealth: Payer: Self-pay | Admitting: Registered Nurse

## 2023-12-11 ENCOUNTER — Encounter: Payer: Self-pay | Admitting: Registered Nurse

## 2023-12-11 DIAGNOSIS — L84 Corns and callosities: Secondary | ICD-10-CM

## 2023-12-11 NOTE — Telephone Encounter (Signed)
 Patient reported she has been trying to treat corns on right foot with OTC (salacyltic acid) but painful and unsure what to do.  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  Discussed ensure shoes not tightfitting and wearing corn pad over top of affected area and applying salicylic acid per manufacturer instructions.  Exitcare handout on corns and callouses sent to patient my chart.  Consider podiatry appt if not improving with plan of care.  Patient verbalized understanding information/instructions and had no further questions at that time.

## 2024-01-14 ENCOUNTER — Encounter: Payer: Self-pay | Admitting: Registered Nurse

## 2024-01-14 ENCOUNTER — Telehealth: Payer: Self-pay | Admitting: Registered Nurse

## 2024-01-14 DIAGNOSIS — Z0000 Encounter for general adult medical examination without abnormal findings: Secondary | ICD-10-CM

## 2024-01-14 NOTE — Telephone Encounter (Signed)
 Received epic notification Hgba1c order expiring.  Noted no Be Well 2026 labs completed since 14 May 2024.  Female executive panel and Hgba1c ordered for patient to complete prior to 13 Mar 2024  RN Thersia Flax notified to schedule patient and my chart message sent to patient

## 2024-01-16 ENCOUNTER — Other Ambulatory Visit

## 2024-01-16 VITALS — BP 192/101 | Ht 60.5 in | Wt 164.0 lb

## 2024-01-16 DIAGNOSIS — Z0000 Encounter for general adult medical examination without abnormal findings: Secondary | ICD-10-CM

## 2024-01-16 NOTE — Telephone Encounter (Signed)
 Patient scheduled to have labs drawn today 01/16/24 with RN Thersia Flax

## 2024-01-16 NOTE — Progress Notes (Signed)
 Be Well labs.

## 2024-01-17 ENCOUNTER — Ambulatory Visit: Payer: Self-pay | Admitting: Registered Nurse

## 2024-01-17 LAB — CMP12+LP+TP+TSH+6AC+CBC/D/PLT
ALT: 19 IU/L (ref 0–32)
AST: 23 IU/L (ref 0–40)
Albumin: 4.4 g/dL (ref 3.9–4.9)
Alkaline Phosphatase: 82 IU/L (ref 44–121)
BUN/Creatinine Ratio: 13 (ref 12–28)
BUN: 10 mg/dL (ref 8–27)
Basophils Absolute: 0.1 10*3/uL (ref 0.0–0.2)
Basos: 1 %
Bilirubin Total: 0.3 mg/dL (ref 0.0–1.2)
Calcium: 9.7 mg/dL (ref 8.7–10.3)
Chloride: 101 mmol/L (ref 96–106)
Chol/HDL Ratio: 3.1 ratio (ref 0.0–4.4)
Cholesterol, Total: 171 mg/dL (ref 100–199)
Creatinine, Ser: 0.75 mg/dL (ref 0.57–1.00)
EOS (ABSOLUTE): 0.3 10*3/uL (ref 0.0–0.4)
Eos: 3 %
Estimated CHD Risk: <0.5 times avg. (ref 0.0–1.0)
Free Thyroxine Index: 2.2 (ref 1.2–4.9)
GGT: 12 IU/L (ref 0–60)
Globulin, Total: 3.2 g/dL (ref 1.5–4.5)
Glucose: 86 mg/dL (ref 70–99)
HDL: 56 mg/dL (ref >39)
Hematocrit: 45.8 % (ref 34.0–46.6)
Hemoglobin: 14.3 g/dL (ref 11.1–15.9)
Immature Grans (Abs): 0 10*3/uL (ref 0.0–0.1)
Immature Granulocytes: 0 %
Iron: 122 ug/dL (ref 27–139)
LDH: 296 IU/L — ABNORMAL HIGH (ref 119–226)
LDL Chol Calc (NIH): 96 mg/dL (ref 0–99)
Lymphocytes Absolute: 2.9 10*3/uL (ref 0.7–3.1)
Lymphs: 31 %
MCH: 30.6 pg (ref 26.6–33.0)
MCHC: 31.2 g/dL — ABNORMAL LOW (ref 31.5–35.7)
MCV: 98 fL — ABNORMAL HIGH (ref 79–97)
Monocytes Absolute: 0.7 10*3/uL (ref 0.1–0.9)
Monocytes: 7 %
Neutrophils Absolute: 5.6 10*3/uL (ref 1.4–7.0)
Neutrophils: 58 %
Phosphorus: 3.5 mg/dL (ref 3.0–4.3)
Platelets: 330 10*3/uL (ref 150–450)
Potassium: 4.5 mmol/L (ref 3.5–5.2)
RBC: 4.67 x10E6/uL (ref 3.77–5.28)
RDW: 14.4 % (ref 11.7–15.4)
Sodium: 137 mmol/L (ref 134–144)
T3 Uptake Ratio: 29 % (ref 24–39)
T4, Total: 7.6 ug/dL (ref 4.5–12.0)
TSH: 1.41 u[IU]/mL (ref 0.450–4.500)
Total Protein: 7.6 g/dL (ref 6.0–8.5)
Triglycerides: 106 mg/dL (ref 0–149)
Uric Acid: 6.9 mg/dL (ref 3.0–7.2)
VLDL Cholesterol Cal: 19 mg/dL (ref 5–40)
WBC: 9.6 10*3/uL (ref 3.4–10.8)
eGFR: 89 mL/min/{1.73_m2} (ref >59)

## 2024-01-17 LAB — HEMOGLOBIN A1C
Est. average glucose Bld gHb Est-mCnc: 120 mg/dL
Hgb A1c MFr Bld: 5.8 % — ABNORMAL HIGH (ref 4.8–5.6)

## 2024-01-17 NOTE — Telephone Encounter (Signed)
 Patient signed Be Well 2026 ROI and tobacco attestation negative for smoking/vaping in previous 12 months.  NP completed provider portion today and [employer] form given to HR Margo Shells met requirements for insurance discount starting 14 May 2024

## 2024-03-03 ENCOUNTER — Telehealth: Payer: Self-pay

## 2024-04-10 ENCOUNTER — Encounter: Payer: Self-pay | Admitting: Student

## 2024-04-10 ENCOUNTER — Ambulatory Visit: Admitting: Student

## 2024-04-10 VITALS — Temp 97.8°F

## 2024-04-10 NOTE — Progress Notes (Signed)
   Subjective:    Patient ID: Regina Hutchinson, female    DOB: 07-22-1961, 63 y.o.   MRN: [id number]  HPI  32 female present for sinus pressure and congestion for approximately 8 days. Denies fever, chills, cough. She experienced greenish discharge when blowing her nose yesterday and has concerns for sinus infection. Denies SOB, GI complaints. Coworkers have been sneezing and coughing.   Review of Systems     Objective:   Physical Exam HENT:     Head: Normocephalic.     Right Ear: Tympanic membrane, ear canal and external ear normal.     Left Ear: Tympanic membrane, ear canal and external ear normal.     Nose: Nose normal.     Mouth/Throat:     Mouth: Mucous membranes are moist.     Pharynx: No oropharyngeal exudate or posterior oropharyngeal erythema.  Eyes:     Conjunctiva/sclera: Conjunctivae normal.  Cardiovascular:     Rate and Rhythm: Normal rate.  Pulmonary:     Effort: Pulmonary effort is normal.  Skin:    General: Skin is warm.  Neurological:     Mental Status: She is alert.           Assessment & Plan:   Presents with 8 days of worsening sinus complaints. Nasal discharge has started turn colors. Afebrile, no GI symptoms. Covid test performed in clinic today was negative. Symptoms consistent with possible sinus infection. Amoxicillin  875 BID for days was provided to patient from pharmacy supply. Advised to present back to clinic if symptoms do not improve or worsen.
